# Patient Record
Sex: Female | Born: 1995 | Race: Black or African American | Hispanic: No | Marital: Single | Smoking: Former smoker
Health system: Southern US, Community
[De-identification: ages and names within clinical notes are randomized; demographics above are authoritative.]

## PROBLEM LIST (undated history)

## (undated) DIAGNOSIS — D649 Anemia, unspecified: Secondary | ICD-10-CM

## (undated) DIAGNOSIS — N83209 Unspecified ovarian cyst, unspecified side: Secondary | ICD-10-CM

## (undated) HISTORY — PX: NO PAST SURGERIES: SHX2092

---

## 2020-03-10 ENCOUNTER — Encounter (HOSPITAL_COMMUNITY): Payer: Self-pay

## 2020-03-10 ENCOUNTER — Emergency Department (HOSPITAL_COMMUNITY)
Admission: EM | Admit: 2020-03-10 | Discharge: 2020-03-10 | Disposition: A | Discharge status: Home / Self Care | Payer: Medicaid Other | Attending: Emergency Medicine | Admitting: Emergency Medicine

## 2020-03-10 ENCOUNTER — Other Ambulatory Visit: Payer: Self-pay

## 2020-03-10 DIAGNOSIS — G4489 Other headache syndrome: Secondary | ICD-10-CM | POA: Diagnosis not present

## 2020-03-10 DIAGNOSIS — R519 Headache, unspecified: Secondary | ICD-10-CM | POA: Diagnosis present

## 2020-03-10 LAB — POC URINE PREG, ED: Preg Test, Ur: NEGATIVE

## 2020-03-10 MED ORDER — DEXAMETHASONE SODIUM PHOSPHATE 10 MG/ML IJ SOLN
10.0000 mg | Freq: Once | INTRAMUSCULAR | Status: AC
  Administered 2020-03-10: 10 mg via INTRAMUSCULAR
  Filled 2020-03-10: qty 1

## 2020-03-10 MED ORDER — KETOROLAC TROMETHAMINE 60 MG/2ML IM SOLN
60.0000 mg | Freq: Once | INTRAMUSCULAR | Status: AC
  Administered 2020-03-10: 60 mg via INTRAMUSCULAR
  Filled 2020-03-10: qty 2

## 2020-03-10 NOTE — ED Notes (Signed)
Pt Not in room, pt left room without paperwork or being discharged

## 2020-03-10 NOTE — ED Triage Notes (Signed)
Pt reports headache that has worsened over the last 5 hours. States that she has had headaches before, but not that lasted this long. Endorses photosensitivity. Denies nausea or vomiting,

## 2020-03-10 NOTE — ED Provider Notes (Signed)
[facility]-EMERGENCY DEPT Provider Note   CSN: [id number] Arrival date & time: 03/10/20  0117     History Chief Complaint  Patient presents with  . Headache    Amanda Howe is a 24 y.o. female.  The history is provided by the patient.  Headache Pain location:  L temporal Quality:  Dull Radiates to:  Does not radiate Severity currently:  10/10 Severity at highest:  10/10 Onset quality:  Gradual Duration:  24 weeks Timing:  Constant Progression:  Unchanged Chronicity:  Chronic Similar to prior headaches: yes   Context: not activity, not exposure to bright light, not caffeine, not coughing, not defecating, not eating, not stress, not exposure to cold air, not intercourse, not loud noise and not straining   Relieved by:  Nothing Worsened by:  Nothing Ineffective treatments:  None tried Associated symptoms: no abdominal pain, no back pain, no blurred vision, no congestion, no cough, no diarrhea, no dizziness, no drainage, no ear pain, no eye pain, no facial pain, no fatigue, no fever, no focal weakness, no hearing loss, no loss of balance, no myalgias, no nausea, no near-syncope, no neck pain, no neck stiffness, no numbness, no paresthesias, no photophobia, no seizures, no sinus pressure, no sore throat, no swollen glands, no syncope, no tingling, no URI, no visual change, no vomiting and no weakness   Risk factors: no anger   Patient has chronic daily headaches and states no one can tell her why and no one has tried.  She is not on suppressive medication and nothing helps her headaches.  No f/c/r.  No neck pain.       History reviewed. No pertinent past medical history.  There are no problems to display for this patient.   History reviewed. No pertinent surgical history.   OB History   No obstetric history on file.     History reviewed. No pertinent family history.  Social History   Tobacco Use  . Smoking status: Not on file  Substance Use  Topics  . Alcohol use: Not on file  . Drug use: Not on file    Home Medications Prior to Admission medications   Not on File    Allergies    Patient has no known allergies.  Review of Systems   Review of Systems  Constitutional: Negative for fatigue and fever.  HENT: Negative for congestion, ear pain, hearing loss, postnasal drip, sinus pressure and sore throat.   Eyes: Negative for blurred vision, photophobia and pain.  Respiratory: Negative for cough.   Cardiovascular: Negative for syncope and near-syncope.  Gastrointestinal: Negative for abdominal pain, diarrhea, nausea and vomiting.  Genitourinary: Negative for difficulty urinating.  Musculoskeletal: Negative for back pain, myalgias, neck pain and neck stiffness.  Neurological: Positive for headaches. Negative for dizziness, focal weakness, seizures, weakness, numbness, paresthesias and loss of balance.  All other systems reviewed and are negative.   Physical Exam Updated Vital Signs BP 114/74 (BP Location: Left Arm)   Pulse 66   Temp 98.2 F (36.8 C) (Oral)   Resp 18   Ht 5\' 4"  (1.626 m)   Wt 59 kg   SpO2 100%   BMI 22.31 kg/m   Physical Exam Vitals and nursing note reviewed.  Constitutional:      General: She is not in acute distress.    Appearance: Normal appearance.  HENT:     Head: Normocephalic and atraumatic.     Nose: Nose normal.     Mouth/Throat:  Mouth: Mucous membranes are moist.     Pharynx: Oropharynx is clear.  Eyes:     Extraocular Movements: Extraocular movements intact.     Conjunctiva/sclera: Conjunctivae normal.     Pupils: Pupils are equal, round, and reactive to light.  Cardiovascular:     Rate and Rhythm: Normal rate and regular rhythm.     Pulses: Normal pulses.     Heart sounds: Normal heart sounds.  Pulmonary:     Effort: Pulmonary effort is normal.     Breath sounds: Normal breath sounds.  Abdominal:     General: Abdomen is flat. Bowel sounds are normal.     Palpations:  Abdomen is soft.     Tenderness: There is no abdominal tenderness. There is no guarding.  Musculoskeletal:        General: Normal range of motion.     Cervical back: Normal range of motion and neck supple.  Skin:    General: Skin is warm and dry.     Capillary Refill: Capillary refill takes less than 2 seconds.  Neurological:     General: No focal deficit present.     Mental Status: She is alert and oriented to person, place, and time.     Cranial Nerves: No cranial nerve deficit.     Deep Tendon Reflexes: Reflexes normal.  Psychiatric:        Mood and Affect: Mood normal.        Behavior: Behavior normal.     ED Results / Procedures / Treatments   Labs (all labs ordered are listed, but only abnormal results are displayed) Labs Reviewed  POC URINE PREG, ED    EKG None  Radiology No results found.  Procedures Procedures (including critical care time)  Medications Ordered in ED Medications  ketorolac (TORADOL) injection 60 mg (60 mg Intramuscular Given 03/10/20 0353)  dexamethasone (DECADRON) injection 10 mg (10 mg Intramuscular Given 03/10/20 0353)    ED Course  I have reviewed the triage vital signs and the nursing notes.  Pertinent labs & imaging results that were available during my care of the patient were reviewed by me and considered in my medical decision making (see chart for details).    Patient was medicated.  EDP planned reassessment and referral to headache specialist given ongoing symptoms.  Informed by patient's nurse that the patient left without saying anything to anyone.    Amanda Howe was evaluated in Emergency Department on 03/10/2020 for the symptoms described in the history of present illness. She was evaluated in the context of the global COVID-19 pandemic, which necessitated consideration that the patient might be at risk for infection with the SARS-CoV-2 virus that causes COVID-19. Institutional protocols and algorithms that pertain to the  evaluation of patients at risk for COVID-19 are in a state of rapid change based on information released by regulatory bodies including the CDC and federal and state organizations. These policies and algorithms were followed during the patient's care in the ED.  Final Clinical Impression(s) / ED Diagnoses Final diagnoses:  Other headache syndrome   Patient eloped during treatment    Lee Kalt, MD 03/10/20 7169

## 2020-05-24 ENCOUNTER — Other Ambulatory Visit: Payer: Self-pay

## 2020-05-24 ENCOUNTER — Inpatient Hospital Stay (HOSPITAL_COMMUNITY)
Admission: AD | Admit: 2020-05-24 | Discharge: 2020-05-24 | Disposition: A | Discharge status: Home / Self Care | Payer: Medicaid Other | Attending: Obstetrics & Gynecology | Admitting: Obstetrics & Gynecology

## 2020-05-24 ENCOUNTER — Encounter (HOSPITAL_COMMUNITY): Payer: Self-pay | Admitting: Emergency Medicine

## 2020-05-24 ENCOUNTER — Inpatient Hospital Stay (HOSPITAL_COMMUNITY): Payer: Medicaid Other

## 2020-05-24 ENCOUNTER — Emergency Department (HOSPITAL_COMMUNITY)
Admission: EM | Admit: 2020-05-24 | Discharge: 2020-05-24 | Discharge status: Against Medical Advice | Payer: Medicaid Other

## 2020-05-24 DIAGNOSIS — O3481 Maternal care for other abnormalities of pelvic organs, first trimester: Secondary | ICD-10-CM | POA: Diagnosis not present

## 2020-05-24 DIAGNOSIS — O23591 Infection of other part of genital tract in pregnancy, first trimester: Secondary | ICD-10-CM | POA: Insufficient documentation

## 2020-05-24 DIAGNOSIS — N939 Abnormal uterine and vaginal bleeding, unspecified: Secondary | ICD-10-CM

## 2020-05-24 DIAGNOSIS — B9689 Other specified bacterial agents as the cause of diseases classified elsewhere: Secondary | ICD-10-CM | POA: Diagnosis not present

## 2020-05-24 DIAGNOSIS — N83201 Unspecified ovarian cyst, right side: Secondary | ICD-10-CM | POA: Diagnosis not present

## 2020-05-24 DIAGNOSIS — O368390 Maternal care for abnormalities of the fetal heart rate or rhythm, unspecified trimester, not applicable or unspecified: Secondary | ICD-10-CM

## 2020-05-24 DIAGNOSIS — Z3A01 Less than 8 weeks gestation of pregnancy: Secondary | ICD-10-CM | POA: Diagnosis not present

## 2020-05-24 DIAGNOSIS — N760 Acute vaginitis: Secondary | ICD-10-CM | POA: Diagnosis not present

## 2020-05-24 DIAGNOSIS — Z87891 Personal history of nicotine dependence: Secondary | ICD-10-CM | POA: Insufficient documentation

## 2020-05-24 DIAGNOSIS — O368310 Maternal care for abnormalities of the fetal heart rate or rhythm, first trimester, not applicable or unspecified: Secondary | ICD-10-CM | POA: Diagnosis not present

## 2020-05-24 DIAGNOSIS — O4691 Antepartum hemorrhage, unspecified, first trimester: Secondary | ICD-10-CM

## 2020-05-24 DIAGNOSIS — O209 Hemorrhage in early pregnancy, unspecified: Secondary | ICD-10-CM | POA: Diagnosis present

## 2020-05-24 DIAGNOSIS — O99891 Other specified diseases and conditions complicating pregnancy: Secondary | ICD-10-CM | POA: Diagnosis not present

## 2020-05-24 HISTORY — DX: Anemia, unspecified: D64.9

## 2020-05-24 LAB — WET PREP, GENITAL
Sperm: NONE SEEN
Trich, Wet Prep: NONE SEEN
Yeast Wet Prep HPF POC: NONE SEEN

## 2020-05-24 LAB — CBC WITH DIFFERENTIAL/PLATELET
Abs Immature Granulocytes: 0.04 10*3/uL (ref 0.00–0.07)
Basophils Absolute: 0 10*3/uL (ref 0.0–0.1)
Basophils Relative: 0 %
Eosinophils Absolute: 0.3 10*3/uL (ref 0.0–0.5)
Eosinophils Relative: 3 %
HCT: 35.3 % — ABNORMAL LOW (ref 36.0–46.0)
Hemoglobin: 12.2 g/dL (ref 12.0–15.0)
Immature Granulocytes: 0 %
Lymphocytes Relative: 12 %
Lymphs Abs: 1.5 10*3/uL (ref 0.7–4.0)
MCH: 30.5 pg (ref 26.0–34.0)
MCHC: 34.6 g/dL (ref 30.0–36.0)
MCV: 88.3 fL (ref 80.0–100.0)
Monocytes Absolute: 1.1 10*3/uL — ABNORMAL HIGH (ref 0.1–1.0)
Monocytes Relative: 9 %
Neutro Abs: 9.9 10*3/uL — ABNORMAL HIGH (ref 1.7–7.7)
Neutrophils Relative %: 76 %
Platelets: 266 10*3/uL (ref 150–400)
RBC: 4 MIL/uL (ref 3.87–5.11)
RDW: 11.9 % (ref 11.5–15.5)
WBC: 13 10*3/uL — ABNORMAL HIGH (ref 4.0–10.5)
nRBC: 0 % (ref 0.0–0.2)

## 2020-05-24 LAB — URINALYSIS, ROUTINE W REFLEX MICROSCOPIC
Bilirubin Urine: NEGATIVE
Glucose, UA: NEGATIVE mg/dL
Hgb urine dipstick: NEGATIVE
Ketones, ur: 80 mg/dL — AB
Leukocytes,Ua: NEGATIVE
Nitrite: NEGATIVE
Protein, ur: NEGATIVE mg/dL
Specific Gravity, Urine: 1.013 (ref 1.005–1.030)
pH: 5 (ref 5.0–8.0)

## 2020-05-24 LAB — HCG, QUANTITATIVE, PREGNANCY: hCG, Beta Chain, Quant, S: 69567 m[IU]/mL — ABNORMAL HIGH (ref <5)

## 2020-05-24 LAB — ABO/RH: ABO/RH(D): O POS

## 2020-05-24 LAB — HIV ANTIBODY (ROUTINE TESTING W REFLEX): HIV Screen 4th Generation wRfx: NONREACTIVE

## 2020-05-24 LAB — POCT PREGNANCY, URINE: Preg Test, Ur: POSITIVE — AB

## 2020-05-24 MED ORDER — METRONIDAZOLE 0.75 % VA GEL: 1.0000 | Freq: Two times a day (BID) | VAGINAL | 0 refills | Status: DC

## 2020-05-24 NOTE — ED Notes (Signed)
Pt said she did not want to wait because she needs to get back to her kids and left

## 2020-05-24 NOTE — MAU Note (Signed)
.   Amanda Howe is a 24 y.o. at [redacted]w[redacted]d here in MAU reporting: lower abdominal cramping. Vaginal bleeding like a period earlier today denies vaginal bleeding at present  LMP: 04/10/20 Onset of complaint: today Pain score: 5 Vitals:   05/24/20 1633 05/24/20 1820  BP: 112/72 121/71  Pulse: 78 63  Resp: 14 16  Temp: 98.9 F (37.2 C) 98.2 F (36.8 C)  SpO2: 100% 100%     FHT: Lab orders placed from triage: UA/UPT

## 2020-05-24 NOTE — Discharge Instructions (Signed)
Bacterial Vaginosis  Bacterial vaginosis is an infection of the vagina. It happens when too many normal germs (healthy bacteria) grow in the vagina. This infection puts you at risk for infections from sex (STIs). Treating this infection can lower your risk for some STIs. You should also treat this if you are pregnant. It can cause your baby to be born early. Follow these instructions at home: Medicines  Take over-the-counter and prescription medicines only as told by your doctor.  Take or use your antibiotic medicine as told by your doctor. Do not stop taking or using it even if you start to feel better. General instructions  If you your sexual partner is a woman, tell her that you have this infection. She needs to get treatment if she has symptoms. If you have a female partner, he does not need to be treated.  During treatment: ? Avoid sex. ? Do not douche. ? Avoid alcohol as told. ? Avoid breastfeeding as told.  Drink enough fluid to keep your pee (urine) clear or pale yellow.  Keep your vagina and butt (rectum) clean. ? Wash the area with warm water every day. ? Wipe from front to back after you use the toilet.  Keep all follow-up visits as told by your doctor. This is important. Preventing this condition  Do not douche.  Use only warm water to wash around your vagina.  Use protection when you have sex. This includes: ? Latex condoms. ? Dental dams.  Limit how many people you have sex with. It is best to only have sex with the same person (be monogamous).  Get tested for STIs. Have your partner get tested.  Wear underwear that is cotton or lined with cotton.  Avoid tight pants and pantyhose. This is most important in summer.  Do not use any products that have nicotine or tobacco in them. These include cigarettes and e-cigarettes. If you need help quitting, ask your doctor.  Do not use illegal drugs.  Limit how much alcohol you drink. Contact a doctor if:  Your  symptoms do not get better, even after you are treated.  You have more discharge or pain when you pee (urinate).  You have a fever.  You have pain in your belly (abdomen).  You have pain with sex.  Your bleed from your vagina between periods. Summary  This infection happens when too many germs (bacteria) grow in the vagina.  Treating this condition can lower your risk for some infections from sex (STIs).  You should also treat this if you are pregnant. It can cause early (premature) birth.  Do not stop taking or using your antibiotic medicine even if you start to feel better. This information is not intended to replace advice given to you by your health care provider. Make sure you discuss any questions you have with your health care provider. Document Revised: 08/28/2017 Document Reviewed: 05/31/2016 Elsevier Patient Education  2020 [employer] Inc[employer]   First Trimester of Pregnancy  The first trimester of pregnancy is from week 1 until the end of week 13 (months 1 through 3). During this time, your baby will begin to develop inside you. At 6-8 weeks, the eyes and face are formed, and the heartbeat can be seen on ultrasound. At the end of 12 weeks, all the baby's organs are formed. Prenatal care is all the medical care you receive before the birth of your baby. Make sure you get good prenatal care and follow all of your doctor's instructions. Follow   these instructions at home: Medicines  Take over-the-counter and prescription medicines only as told by your doctor. Some medicines are safe and some medicines are not safe during pregnancy.  Take a prenatal vitamin that contains at least 600 micrograms (mcg) of folic acid.  If you have trouble pooping (constipation), take medicine that will make your stool soft (stool softener) if your doctor approves. Eating and drinking   Eat regular, healthy meals.  Your doctor will tell you the amount of weight gain that is right for  you.  Avoid raw meat and uncooked cheese.  If you feel sick to your stomach (nauseous) or throw up (vomit): ? Eat 4 or 5 small meals a day instead of 3 large meals. ? Try eating a few soda crackers. ? Drink liquids between meals instead of during meals.  To prevent constipation: ? Eat foods that are high in fiber, like fresh fruits and vegetables, whole grains, and beans. ? Drink enough fluids to keep your pee (urine) clear or pale yellow. Activity  Exercise only as told by your doctor. Stop exercising if you have cramps or pain in your lower belly (abdomen) or low back.  Do not exercise if it is too hot, too humid, or if you are in a place of great height (high altitude).  Try to avoid standing for long periods of time. Move your legs often if you must stand in one place for a long time.  Avoid heavy lifting.  Wear low-heeled shoes. Sit and stand up straight.  You can have sex unless your doctor tells you not to. Relieving pain and discomfort  Wear a good support bra if your breasts are sore.  Take warm water baths (sitz baths) to soothe pain or discomfort caused by hemorrhoids. Use hemorrhoid cream if your doctor says it is okay.  Rest with your legs raised if you have leg cramps or low back pain.  If you have puffy, bulging veins (varicose veins) in your legs: ? Wear support hose or compression stockings as told by your doctor. ? Raise (elevate) your feet for 15 minutes, 3-4 times a day. ? Limit salt in your food. Prenatal care  Schedule your prenatal visits by the twelfth week of pregnancy.  Write down your questions. Take them to your prenatal visits.  Keep all your prenatal visits as told by your doctor. This is important. Safety  Wear your seat belt at all times when driving.  Make a list of emergency phone numbers. The list should include numbers for family, friends, the hospital, and police and fire departments. General instructions  Ask your doctor for a  referral to a local prenatal class. Begin classes no later than at the start of month 6 of your pregnancy.  Ask for help if you need counseling or if you need help with nutrition. Your doctor can give you advice or tell you where to go for help.  Do not use hot tubs, steam rooms, or saunas.  Do not douche or use tampons or scented sanitary pads.  Do not cross your legs for long periods of time.  Avoid all herbs and alcohol. Avoid drugs that are not approved by your doctor.  Do not use any tobacco products, including cigarettes, chewing tobacco, and electronic cigarettes. If you need help quitting, ask your doctor. You may get counseling or other support to help you quit.  Avoid cat litter boxes and soil used by cats. These carry germs that can cause birth defects in the   and can cause a loss of your baby (miscarriage) or stillbirth.  Visit your dentist. At home, brush your teeth with a soft toothbrush. Be gentle when you floss. Contact a doctor if:  You are dizzy.  You have mild cramps or pressure in your lower belly.  You have a nagging pain in your belly area.  You continue to feel sick to your stomach, you throw up, or you have watery poop (diarrhea).  You have a bad smelling fluid coming from your vagina.  You have pain when you pee (urinate).  You have increased puffiness (swelling) in your face, hands, legs, or ankles. Get help right away if:  You have a fever.  You are leaking fluid from your vagina.  You have spotting or bleeding from your vagina.  You have very bad belly cramping or pain.  You gain or lose weight rapidly.  You throw up blood. It may look like coffee grounds.  You are around people who have [place] measles, fifth disease, or chickenpox.  You have a very bad headache.  You have shortness of breath.  You have any kind of trauma, such as from a fall or a car accident. Summary  The first trimester of pregnancy is from week 1 until the end of week 13  (months 1 through 3).  To take care of yourself and your unborn baby, you will need to eat healthy meals, take medicines only if your doctor tells you to do so, and do activities that are safe for you and your baby.  Keep all follow-up visits as told by your doctor. This is important as your doctor will have to ensure that your baby is healthy and growing well. This information is not intended to replace advice given to you by your health care provider. Make sure you discuss any questions you have with your health care provider. Document Revised: 01/06/2019 Document Reviewed: 09/23/2016 Elsevier Patient Education  2020 [employer].

## 2020-05-24 NOTE — ED Triage Notes (Signed)
Pt reports she is [redacted] weeks pregnant and has developed vaginal bleeding today.

## 2020-05-24 NOTE — MAU Provider Note (Signed)
History     CSN: [id number]  Arrival date and time: 05/24/20 1629   First Provider Initiated Contact with Patient 05/24/20 1843      Chief Complaint  Patient presents with  . Vaginal Bleeding  . Abdominal Pain   HPI  [name] is a 24 y.o. G2P1001 at [redacted]w[redacted]d who presents to MAU today with complaint of vaginal bleeding since earlier today. The patient states initially it was "a lot" of bleeding and then when she went to [facility] on arrival there was no blood noted. She states mild associated cramping rated at 4/10. No pain meds taken. She denies abnormal discharge, UTI symptoms or fever. She has had intermittent mild N/V. She was seen at the pregnancy care center recently and has pictures that look like IUGS with YS. She states they told her it looked ok, but was too small to be sure. She has just moved to the area from [facility].   OB History    Gravida  2   Para  1   Term  1   Preterm      AB      Living  1     SAB      TAB      Ectopic      Multiple      Live Births  1           Past Medical History:  Diagnosis Date  . Anemia     History reviewed. No pertinent surgical history.  Family History  Problem Relation Age of Onset  . Hypertension Mother   . Hypertension Maternal Grandmother     Social History   Tobacco Use  . Smoking status: Former Games developer  . Smokeless tobacco: Former Clinical biochemist  . Vaping Use: Never used  Substance Use Topics  . Alcohol use: Not Currently  . Drug use: Never    Allergies: No Known Allergies  No medications prior to admission.    Review of Systems  Constitutional: Negative for fever.  Gastrointestinal: Positive for abdominal pain, nausea and vomiting. Negative for constipation and diarrhea.  Genitourinary: Positive for vaginal bleeding. Negative for dysuria, frequency, urgency and vaginal discharge.   Physical Exam   Blood pressure 110/62, pulse 65, temperature 98.2 F (36.8 C), resp. rate 15, height 5\' 4"   (1.626 m), weight 64.9 kg, last menstrual period 04/10/2020, SpO2 100 %.  Physical Exam Vitals and nursing note reviewed. Exam conducted with a chaperone present.  Constitutional:      General: She is not in acute distress.    Appearance: She is well-developed and normal weight.  HENT:     Head: Normocephalic and atraumatic.  Cardiovascular:     Rate and Rhythm: Normal rate.  Pulmonary:     Effort: Pulmonary effort is normal.  Abdominal:     General: Abdomen is flat. Bowel sounds are normal. There is no distension.     Palpations: Abdomen is soft. There is no mass.     Tenderness: There is no abdominal tenderness. There is no guarding or rebound.  Genitourinary:    Vagina: Bleeding (scant pink) present. No vaginal discharge.     Cervix: Normal.     Uterus: Normal. Not enlarged and not tender.      Adnexa: Right adnexa normal and left adnexa normal.       Right: No mass or tenderness.         Left: No mass or tenderness.  Comments: Cervix: closed, thick, firm Skin:    General: Skin is warm and dry.     Findings: No erythema.  Neurological:     Mental Status: She is alert and oriented to person, place, and time.     Results for orders placed or performed during the hospital encounter of 05/24/20 (from the past 24 hour(s))  Pregnancy, urine POC     Status: Abnormal   Collection Time: 05/24/20  6:15 PM  Result Value Ref Range   Preg Test, Ur POSITIVE (A) NEGATIVE  Urinalysis, Routine w reflex microscopic Urine, Clean Catch     Status: Abnormal   Collection Time: 05/24/20  6:23 PM  Result Value Ref Range   Color, Urine YELLOW YELLOW   APPearance CLEAR CLEAR   Specific Gravity, Urine 1.013 1.005 - 1.030   pH 5.0 5.0 - 8.0   Glucose, UA NEGATIVE NEGATIVE mg/dL   Hgb urine dipstick NEGATIVE NEGATIVE   Bilirubin Urine NEGATIVE NEGATIVE   Ketones, ur 80 (A) NEGATIVE mg/dL   Protein, ur NEGATIVE NEGATIVE mg/dL   Nitrite NEGATIVE NEGATIVE   Leukocytes,Ua NEGATIVE NEGATIVE   Wet prep, genital     Status: Abnormal   Collection Time: 05/24/20  6:50 PM  Result Value Ref Range   Yeast Wet Prep HPF POC NONE SEEN NONE SEEN   Trich, Wet Prep NONE SEEN NONE SEEN   Clue Cells Wet Prep HPF POC PRESENT (A) NONE SEEN   WBC, Wet Prep HPF POC MANY (A) NONE SEEN   Sperm NONE SEEN   CBC with Differential/Platelet     Status: Abnormal   Collection Time: 05/24/20  7:02 PM  Result Value Ref Range   WBC 13.0 (H) 4.0 - 10.5 K/uL   RBC 4.00 3.87 - 5.11 MIL/uL   Hemoglobin 12.2 12.0 - 15.0 g/dL   HCT 56.4 (L) 36 - 46 %   MCV 88.3 80.0 - 100.0 fL   MCH 30.5 26.0 - 34.0 pg   MCHC 34.6 30.0 - 36.0 g/dL   RDW 33.2 95.1 - 88.4 %   Platelets 266 150 - 400 K/uL   nRBC 0.0 0.0 - 0.2 %   Neutrophils Relative % 76 %   Neutro Abs 9.9 (H) 1.7 - 7.7 K/uL   Lymphocytes Relative 12 %   Lymphs Abs 1.5 0.7 - 4.0 K/uL   Monocytes Relative 9 %   Monocytes Absolute 1.1 (H) 0 - 1 K/uL   Eosinophils Relative 3 %   Eosinophils Absolute 0.3 0 - 0 K/uL   Basophils Relative 0 %   Basophils Absolute 0.0 0 - 0 K/uL   Immature Granulocytes 0 %   Abs Immature Granulocytes 0.04 0.00 - 0.07 K/uL  ABO/Rh     Status: None   Collection Time: 05/24/20  7:02 PM  Result Value Ref Range   ABO/RH(D) O POS    No rh immune globuloin      NOT A RH IMMUNE GLOBULIN CANDIDATE, PT RH POSITIVE Performed at [facility], [address] [place], [place] [address]   hCG, quantitative, pregnancy     Status: Abnormal   Collection Time: 05/24/20  7:02 PM  Result Value Ref Range   hCG, Beta Chain, Quant, S 69,567 (H) <5 mIU/mL   US OB LESS THAN 14 WEEKS WITH OB TRANSVAGINAL  Result Date: 05/24/2020 CLINICAL DATA:  Bleeding, positive pregnancy test EXAM: OBSTETRIC <14 WK [place] AND TRANSVAGINAL OB US TECHNIQUE: Both transabdominal and transvaginal ultrasound examinations were performed for  complete evaluation of the gestation as well as the maternal uterus, adnexal regions, and pelvic cul-de-sac. Transvaginal  technique was performed to assess early pregnancy. COMPARISON:  None. FINDINGS: Intrauterine gestational sac: Present Yolk sac:  Present Embryo:  Present Cardiac Activity: Present Heart Rate: 94 bpm CRL:  3 mm   5 w   5 d                  [place] EDC: 01/22/2021 Subchorionic hemorrhage:  None visualized. Maternal uterus/adnexae: Ovaries are well visualized. 2 cm cyst is noted within the right ovary. IMPRESSION: Single live intrauterine gestation at 5 weeks 5 days. Continued follow-up as clinically indicated. Electronically Signed   By: Alcide Clever M.D.   On: 05/24/2020 19:56    MAU Course  Procedures None  MDM +UPT UA, wet prep, GC/chlamydia, CBC, ABO/Rh, quant hCG, HIV, RPR and [place] today to rule out ectopic pregnancy  Assessment and Plan  A: SIUP at [redacted]w[redacted]d Fetal bradycardia Vaginal bleeding in pregnancy, first trimester  Bacterial vaginosis   P: Discharge home Rx for Metrogel sent to patient's pharmacy  Bleeding/first trimester precautions discussed Patient advised to follow-up with OB provider of choice. List given.  Outpatient follow-up US ordered in 2 weeks due to fetal bradycardia. I will contact patient with results.  Patient may return to MAU as needed or if her condition were to change or worsen  Vonzella Nipple, PA-C 05/24/2020, 8:27 PM

## 2020-05-24 NOTE — ED Notes (Signed)
Transport called to take pt over

## 2020-05-24 NOTE — ED Triage Notes (Signed)
Emergency Medicine Provider OB Triage Evaluation Note  Amanda Howe is a 24 y.o. female, G2P1, at Unknown gestation who presents to the emergency department with complaints of vaginal bleeding in pregnancy.   July 13th first day lmp. She has had cramping pain for a week, bleeding started today, came right here.  No change in pain when bleeding started  Pregnancy network.    Review of  Systems  Positive: Vaginal bleeding Negative: fevers  Physical Exam  BP 112/72 (BP Location: Left Arm)   Pulse 78   Temp 98.9 F (37.2 C) (Oral)   Resp 14   Ht 5\' 4"  (1.626 m)   Wt 64.9 kg   SpO2 100%   BMI 24.55 kg/m  General: Awake, no distress  HEENT: Atraumatic  Resp: Normal effort  Cardiac: Normal rate Abd: Nondistended, nontender  MSK: Moves all extremities without difficulty Neuro: Speech clear  Medical Decision Making  Pt evaluated for pregnancy concern and is stable for transfer to MAU. Pt is in agreement with plan for transfer.  5:17 PM Discussed with MAU APP, , who accepts patient in transfer.  Clinical Impression  No diagnosis found.     Raynelle Fanning, Cristina Gong 05/24/20 1736

## 2020-05-25 LAB — GC/CHLAMYDIA PROBE AMP (~~LOC~~) NOT AT ARMC
Chlamydia: NEGATIVE
Comment: NEGATIVE
Comment: NORMAL
Neisseria Gonorrhea: NEGATIVE

## 2020-05-25 LAB — RPR: RPR Ser Ql: NONREACTIVE

## 2020-05-30 ENCOUNTER — Inpatient Hospital Stay (HOSPITAL_COMMUNITY)
Admission: AD | Admit: 2020-05-30 | Discharge: 2020-05-31 | Disposition: A | Discharge status: Home / Self Care | Payer: Medicaid Other | Attending: Obstetrics & Gynecology | Admitting: Obstetrics & Gynecology

## 2020-05-30 ENCOUNTER — Encounter (HOSPITAL_COMMUNITY): Payer: Self-pay | Admitting: Obstetrics & Gynecology

## 2020-05-30 ENCOUNTER — Other Ambulatory Visit: Payer: Self-pay

## 2020-05-30 DIAGNOSIS — Z3A01 Less than 8 weeks gestation of pregnancy: Secondary | ICD-10-CM | POA: Insufficient documentation

## 2020-05-30 DIAGNOSIS — O211 Hyperemesis gravidarum with metabolic disturbance: Secondary | ICD-10-CM

## 2020-05-30 DIAGNOSIS — O99281 Endocrine, nutritional and metabolic diseases complicating pregnancy, first trimester: Secondary | ICD-10-CM | POA: Insufficient documentation

## 2020-05-30 DIAGNOSIS — Z79899 Other long term (current) drug therapy: Secondary | ICD-10-CM | POA: Insufficient documentation

## 2020-05-30 DIAGNOSIS — E860 Dehydration: Secondary | ICD-10-CM | POA: Insufficient documentation

## 2020-05-30 DIAGNOSIS — O219 Vomiting of pregnancy, unspecified: Secondary | ICD-10-CM | POA: Diagnosis present

## 2020-05-30 DIAGNOSIS — R112 Nausea with vomiting, unspecified: Secondary | ICD-10-CM

## 2020-05-30 DIAGNOSIS — Z87891 Personal history of nicotine dependence: Secondary | ICD-10-CM | POA: Insufficient documentation

## 2020-05-30 LAB — CBC
HCT: 36.7 % (ref 36.0–46.0)
Hemoglobin: 12.7 g/dL (ref 12.0–15.0)
MCH: 30.4 pg (ref 26.0–34.0)
MCHC: 34.6 g/dL (ref 30.0–36.0)
MCV: 87.8 fL (ref 80.0–100.0)
Platelets: 277 10*3/uL (ref 150–400)
RBC: 4.18 MIL/uL (ref 3.87–5.11)
RDW: 11.9 % (ref 11.5–15.5)
WBC: 16 10*3/uL — ABNORMAL HIGH (ref 4.0–10.5)
nRBC: 0 % (ref 0.0–0.2)

## 2020-05-30 LAB — COMPREHENSIVE METABOLIC PANEL
ALT: 14 U/L (ref 0–44)
AST: 19 U/L (ref 15–41)
Albumin: 3.9 g/dL (ref 3.5–5.0)
Alkaline Phosphatase: 40 U/L (ref 38–126)
Anion gap: 10 (ref 5–15)
BUN: 7 mg/dL (ref 6–20)
CO2: 24 mmol/L (ref 22–32)
Calcium: 9.3 mg/dL (ref 8.9–10.3)
Chloride: 103 mmol/L (ref 98–111)
Creatinine, Ser: 0.84 mg/dL (ref 0.44–1.00)
GFR calc Af Amer: >60 mL/min (ref >60)
GFR calc non Af Amer: >60 mL/min (ref >60)
Glucose, Bld: 83 mg/dL (ref 70–99)
Potassium: 3.9 mmol/L (ref 3.5–5.1)
Sodium: 137 mmol/L (ref 135–145)
Total Bilirubin: 0.8 mg/dL (ref 0.3–1.2)
Total Protein: 7 g/dL (ref 6.5–8.1)

## 2020-05-30 LAB — URINALYSIS, ROUTINE W REFLEX MICROSCOPIC
Bilirubin Urine: NEGATIVE
Glucose, UA: NEGATIVE mg/dL
Hgb urine dipstick: NEGATIVE
Ketones, ur: 80 mg/dL — AB
Leukocytes,Ua: NEGATIVE
Nitrite: NEGATIVE
Protein, ur: NEGATIVE mg/dL
Specific Gravity, Urine: 1.014 (ref 1.005–1.030)
pH: 5 (ref 5.0–8.0)

## 2020-05-30 MED ORDER — FAMOTIDINE IN NACL 20-0.9 MG/50ML-% IV SOLN
20.0000 mg | Freq: Once | INTRAVENOUS | Status: AC
  Administered 2020-05-30: 20 mg via INTRAVENOUS
  Filled 2020-05-30: qty 50

## 2020-05-30 MED ORDER — LACTATED RINGERS IV SOLN: Freq: Once | INTRAVENOUS | Status: AC

## 2020-05-30 MED ORDER — ONDANSETRON HCL 4 MG/2ML IJ SOLN
4.0000 mg | Freq: Once | INTRAMUSCULAR | Status: AC
  Administered 2020-05-30: 4 mg via INTRAVENOUS
  Filled 2020-05-30: qty 2

## 2020-05-30 NOTE — MAU Note (Signed)
Unable to keep down anything for 24hrs. Feeling dizzy and unable to do anything. No pain.

## 2020-05-30 NOTE — MAU Provider Note (Signed)
Chief Complaint: Emesis During Pregnancy   First Provider Initiated Contact with Patient 05/30/20 2047        SUBJECTIVE HPI: Amanda Howe is a 24 y.o. G2P1001 at [redacted]w[redacted]d by LMP who presents to maternity admissions reporting nausea and vomiting for the past several weeks.  Worse over the past 24 hours.  Unable to keep anything down.  States has vomited 8 times in the past hour.   She denies vaginal bleeding, vaginal itching/burning, urinary symptoms, h/a, dizziness,or fever/chills.    Emesis  This is a recurrent problem. The current episode started 1 to 4 weeks ago. The problem occurs 5 to 10 times per day. The problem has been unchanged. There has been no fever. Pertinent negatives include no abdominal pain, chills, coughing, diarrhea, dizziness, fever, headaches or myalgias. She has tried nothing for the symptoms.   RN Note: Unable to keep down anything for 24hrs. Feeling dizzy and unable to do anything. No pain.   Past Medical History:  Diagnosis Date  . Anemia    No past surgical history on file. Social History   Socioeconomic History  . Marital status: Single    Spouse name: Not on file  . Number of children: Not on file  . Years of education: Not on file  . Highest education level: Not on file  Occupational History  . Not on file  Tobacco Use  . Smoking status: Former Games developer  . Smokeless tobacco: Former Clinical biochemist  . Vaping Use: Never used  Substance and Sexual Activity  . Alcohol use: Not Currently  . Drug use: Never  . Sexual activity: Yes  Other Topics Concern  . Not on file  Social History Narrative  . Not on file   Social Determinants of Health   Financial Resource Strain:   . Difficulty of Paying Living Expenses: Not on file  Food Insecurity:   . Worried About Programme researcher, broadcasting/film/video in the Last Year: Not on file  . Ran Out of Food in the Last Year: Not on file  Transportation Needs:   . Lack of Transportation (Medical): Not on file  . Lack of  Transportation (Non-Medical): Not on file  Physical Activity:   . Days of Exercise per Week: Not on file  . Minutes of Exercise per Session: Not on file  Stress:   . Feeling of Stress : Not on file  Social Connections:   . Frequency of Communication with Friends and Family: Not on file  . Frequency of Social Gatherings with Friends and Family: Not on file  . Attends Religious Services: Not on file  . Active Member of Clubs or Organizations: Not on file  . Attends Banker Meetings: Not on file  . Marital Status: Not on file  Intimate Partner Violence:   . Fear of Current or Ex-Partner: Not on file  . Emotionally Abused: Not on file  . Physically Abused: Not on file  . Sexually Abused: Not on file   No current facility-administered medications on file prior to encounter.   Current Outpatient Medications on File Prior to Encounter  Medication Sig Dispense Refill  . metroNIDAZOLE (METROGEL VAGINAL) 0.75 % vaginal gel Place 1 Applicatorful vaginally 2 (two) times daily. 70 g 0  . Prenatal Vit-Fe Fumarate-FA (PRENATAL MULTIVITAMIN) TABS tablet Take 1 tablet by mouth daily at 12 noon.     No Known Allergies  I have reviewed patient's Past Medical Hx, Surgical Hx, Family Hx, Social Hx, medications and allergies.  ROS:  Review of Systems  Constitutional: Negative for chills and fever.  Respiratory: Negative for cough.   Gastrointestinal: Positive for vomiting. Negative for abdominal pain and diarrhea.  Musculoskeletal: Negative for myalgias.  Neurological: Negative for dizziness and headaches.   Review of Systems  Other systems negative   Physical Exam  Physical Exam Patient Vitals for the past 24 hrs:  BP Temp Pulse Resp Height Weight  05/30/20 2035 118/64 98.5 F (36.9 C) 66 18 5\' 4"  (1.626 m) 64 kg   Constitutional: Well-developed, well-nourished female in no acute distress.  Cardiovascular: normal rate Respiratory: normal effort GI: Abd soft, non-tender.  Pos BS x 4 MS: Extremities nontender, no edema, normal ROM Neurologic: Alert and oriented x 4.  GU: Neg CVAT.  PELVIC EXAM: deferred  LAB RESULTS Results for orders placed or performed during the hospital encounter of 05/30/20 (from the past 24 hour(s))  Urinalysis, Routine w reflex microscopic Urine, Clean Catch     Status: Abnormal   Collection Time: 05/30/20  8:39 PM  Result Value Ref Range   Color, Urine YELLOW YELLOW   APPearance CLEAR CLEAR   Specific Gravity, Urine 1.014 1.005 - 1.030   pH 5.0 5.0 - 8.0   Glucose, UA NEGATIVE NEGATIVE mg/dL   Hgb urine dipstick NEGATIVE NEGATIVE   Bilirubin Urine NEGATIVE NEGATIVE   Ketones, ur 80 (A) NEGATIVE mg/dL   Protein, ur NEGATIVE NEGATIVE mg/dL   Nitrite NEGATIVE NEGATIVE   Leukocytes,Ua NEGATIVE NEGATIVE  CBC     Status: Abnormal   Collection Time: 05/30/20  9:22 PM  Result Value Ref Range   WBC 16.0 (H) 4.0 - 10.5 K/uL   RBC 4.18 3.87 - 5.11 MIL/uL   Hemoglobin 12.7 12.0 - 15.0 g/dL   HCT 07/30/20 36 - 46 %   MCV 87.8 80.0 - 100.0 fL   MCH 30.4 26.0 - 34.0 pg   MCHC 34.6 30.0 - 36.0 g/dL   RDW 76.2 83.1 - 51.7 %   Platelets 277 150 - 400 K/uL   nRBC 0.0 0.0 - 0.2 %  Comprehensive metabolic panel     Status: None   Collection Time: 05/30/20  9:22 PM  Result Value Ref Range   Sodium 137 135 - 145 mmol/L   Potassium 3.9 3.5 - 5.1 mmol/L   Chloride 103 98 - 111 mmol/L   CO2 24 22 - 32 mmol/L   Glucose, Bld 83 70 - 99 mg/dL   BUN 7 6 - 20 mg/dL   Creatinine, Ser 07/30/20 0.44 - 1.00 mg/dL   Calcium 9.3 8.9 - 0.73 mg/dL   Total Protein 7.0 6.5 - 8.1 g/dL   Albumin 3.9 3.5 - 5.0 g/dL   AST 19 15 - 41 U/L   ALT 14 0 - 44 U/L   Alkaline Phosphatase 40 38 - 126 U/L   Total Bilirubin 0.8 0.3 - 1.2 mg/dL   GFR calc non Af Amer >60 >60 mL/min   GFR calc Af Amer >60 >60 mL/min   Anion gap 10 5 - 15   --/--/O POS (08/26 1902)  IMAGING   MAU Management/MDM: Ordered IV hydration, Zofran and Pepcid.   We gave her two liters of  fluid She was able to tolerate PO intake afterward.   ASSESSMENT Single IUP at [redacted]w[redacted]d Nausea and vomiting Dehydration  PLAN Discharge home Rx Phenergan for nausea Has appt with me in 2 weeks for New OB appointment  Pt stable at time of discharge. Encouraged to return here  or to other Urgent Care/ED if she develops worsening of symptoms, increase in pain, fever, or other concerning symptoms.    Wynelle Bourgeois CNM, MSN Certified Nurse-Midwife 05/30/2020  8:48 PM

## 2020-05-31 MED ORDER — PROMETHAZINE HCL 25 MG PO TABS
25.0000 mg | ORAL_TABLET | Freq: Four times a day (QID) | ORAL | 2 refills | Status: DC | PRN
Start: 2020-05-31 — End: 2020-07-10

## 2020-05-31 NOTE — Discharge Instructions (Signed)

## 2020-05-31 NOTE — Progress Notes (Signed)
Written and verbal d/c instructions given and understanding voiced. Understands has promethazine at pharmacy to pick up and use for nausea/vomiting

## 2020-06-06 ENCOUNTER — Ambulatory Visit
Admission: RE | Admit: 2020-06-06 | Discharge: 2020-06-06 | Disposition: A | Discharge status: Home / Self Care | Payer: Medicaid Other | Source: Ambulatory Visit | Attending: Medical | Admitting: Medical

## 2020-06-06 ENCOUNTER — Other Ambulatory Visit: Payer: Self-pay

## 2020-06-06 ENCOUNTER — Telehealth: Payer: Self-pay | Admitting: Medical

## 2020-06-06 DIAGNOSIS — O368390 Maternal care for abnormalities of the fetal heart rate or rhythm, unspecified trimester, not applicable or unspecified: Secondary | ICD-10-CM | POA: Insufficient documentation

## 2020-06-06 NOTE — Telephone Encounter (Signed)
I connected with [name] by phone today. I have confirmed her identity using 2 identifiers. [place] results from this morning were discussed. Fetal bradycardia previously seen has resolved. FHR 161 bpm today. Discussed first trimester warning signs and reasons to return to MAU. Patient has an appointment to start prenatal care at [facility]. All questions were answered and patient voiced understanding of results today.   Vonzella Nipple, PA-C 06/06/2020 12:07 PM

## 2020-06-12 ENCOUNTER — Other Ambulatory Visit (HOSPITAL_COMMUNITY)
Admission: RE | Admit: 2020-06-12 | Discharge: 2020-06-12 | Disposition: A | Discharge status: Home / Self Care | Payer: Medicaid Other | Source: Ambulatory Visit | Attending: Advanced Practice Midwife | Admitting: Advanced Practice Midwife

## 2020-06-12 ENCOUNTER — Encounter: Payer: Self-pay | Admitting: Advanced Practice Midwife

## 2020-06-12 ENCOUNTER — Other Ambulatory Visit: Payer: Self-pay

## 2020-06-12 ENCOUNTER — Ambulatory Visit (INDEPENDENT_AMBULATORY_CARE_PROVIDER_SITE_OTHER): Payer: Medicaid Other | Admitting: Advanced Practice Midwife

## 2020-06-12 VITALS — BP 106/65 | HR 80 | Wt 135.0 lb

## 2020-06-12 DIAGNOSIS — Z3481 Encounter for supervision of other normal pregnancy, first trimester: Secondary | ICD-10-CM | POA: Diagnosis not present

## 2020-06-12 DIAGNOSIS — O219 Vomiting of pregnancy, unspecified: Secondary | ICD-10-CM

## 2020-06-12 DIAGNOSIS — Z3A09 9 weeks gestation of pregnancy: Secondary | ICD-10-CM

## 2020-06-12 DIAGNOSIS — Z3480 Encounter for supervision of other normal pregnancy, unspecified trimester: Secondary | ICD-10-CM | POA: Insufficient documentation

## 2020-06-12 DIAGNOSIS — O209 Hemorrhage in early pregnancy, unspecified: Secondary | ICD-10-CM

## 2020-06-12 MED ORDER — ONDANSETRON 4 MG PO TBDP: 4.0000 mg | ORAL_TABLET | Freq: Four times a day (QID) | ORAL | 0 refills | Status: DC | PRN

## 2020-06-12 MED ORDER — PANTOPRAZOLE SODIUM 20 MG PO TBEC: 20.0000 mg | DELAYED_RELEASE_TABLET | Freq: Every day | ORAL | 0 refills | Status: DC

## 2020-06-12 NOTE — Patient Instructions (Signed)
First Trimester of Pregnancy The first trimester of pregnancy is from week 1 until the end of week 13 (months 1 through 3). A week after a sperm fertilizes an egg, the egg will implant on the wall of the uterus. This embryo will begin to develop into a baby. Genes from you and your partner will form the baby. The female genes will determine whether the baby will be a boy or a girl. At 6-8 weeks, the eyes and face will be formed, and the heartbeat can be seen on ultrasound. At the end of 12 weeks, all the baby's organs will be formed. Now that you are pregnant, you will want to do everything you can to have a healthy baby. Two of the most important things are to get good prenatal care and to follow your health care provider's instructions. Prenatal care is all the medical care you receive before the baby's birth. This care will help prevent, find, and treat any problems during the pregnancy and childbirth. Body changes during your first trimester Your body goes through many changes during pregnancy. The changes vary from woman to woman.  You may gain or lose a couple of pounds at first.  You may feel sick to your stomach (nauseous) and you may throw up (vomit). If the vomiting is uncontrollable, call your health care provider.  You may tire easily.  You may develop headaches that can be relieved by medicines. All medicines should be approved by your health care provider.  You may urinate more often. Painful urination may mean you have a bladder infection.  You may develop heartburn as a result of your pregnancy.  You may develop constipation because certain hormones are causing the muscles that push stool through your intestines to slow down.  You may develop hemorrhoids or swollen veins (varicose veins).  Your breasts may begin to grow larger and become tender. Your nipples may stick out more, and the tissue that surrounds them (areola) may become darker.  Your gums may bleed and may be  sensitive to brushing and flossing.  Dark spots or blotches (chloasma, mask of pregnancy) may develop on your face. This will likely fade after the baby is born.  Your menstrual periods will stop.  You may have a loss of appetite.  You may develop cravings for certain kinds of food.  You may have changes in your emotions from day to day, such as being excited to be pregnant or being concerned that something may go wrong with the pregnancy and baby.  You may have more vivid and strange dreams.  You may have changes in your hair. These can include thickening of your hair, rapid growth, and changes in texture. Some women also have hair loss during or after pregnancy, or hair that feels dry or thin. Your hair will most likely return to normal after your baby is born. What to expect at prenatal visits During a routine prenatal visit:  You will be weighed to make sure you and the baby are growing normally.  Your blood pressure will be taken.  Your abdomen will be measured to track your baby's growth.  The fetal heartbeat will be listened to between weeks 10 and 14 of your pregnancy.  Test results from any previous visits will be discussed. Your health care provider may ask you:  How you are feeling.  If you are feeling the baby move.  If you have had any abnormal symptoms, such as leaking fluid, bleeding, severe headaches, or abdominal   cramping.  If you are using any tobacco products, including cigarettes, chewing tobacco, and electronic cigarettes.  If you have any questions. Other tests that may be performed during your first trimester include:  Blood tests to find your blood type and to check for the presence of any previous infections. The tests will also be used to check for low iron levels (anemia) and protein on red blood cells (Rh antibodies). Depending on your risk factors, or if you previously had diabetes during pregnancy, you may have tests to check for high blood sugar  that affects pregnant women (gestational diabetes).  Urine tests to check for infections, diabetes, or protein in the urine.  An ultrasound to confirm the proper growth and development of the baby.  Fetal screens for spinal cord problems (spina bifida) and Down syndrome.  HIV (human immunodeficiency virus) testing. Routine prenatal testing includes screening for HIV, unless you choose not to have this test.  You may need other tests to make sure you and the baby are doing well. Follow these instructions at home: Medicines  Follow your health care provider's instructions regarding medicine use. Specific medicines may be either safe or unsafe to take during pregnancy.  Take a prenatal vitamin that contains at least 600 micrograms (mcg) of folic acid.  If you develop constipation, try taking a stool softener if your health care provider approves. Eating and drinking   Eat a balanced diet that includes fresh fruits and vegetables, whole grains, good sources of protein such as meat, eggs, or tofu, and low-fat dairy. Your health care provider will help you determine the amount of weight gain that is right for you.  Avoid raw meat and uncooked cheese. These carry germs that can cause birth defects in the baby.  Eating four or five small meals rather than three large meals a day may help relieve nausea and vomiting. If you start to feel nauseous, eating a few soda crackers can be helpful. Drinking liquids between meals, instead of during meals, also seems to help ease nausea and vomiting.  Limit foods that are high in fat and processed sugars, such as fried and sweet foods.  To prevent constipation: ? Eat foods that are high in fiber, such as fresh fruits and vegetables, whole grains, and beans. ? Drink enough fluid to keep your urine clear or pale yellow. Activity  Exercise only as directed by your health care provider. Most women can continue their usual exercise routine during  pregnancy. Try to exercise for 30 minutes at least 5 days a week. Exercising will help you: ? Control your weight. ? Stay in shape. ? Be prepared for labor and delivery.  Experiencing pain or cramping in the lower abdomen or lower back is a good sign that you should stop exercising. Check with your health care provider before continuing with normal exercises.  Try to avoid standing for long periods of time. Move your legs often if you must stand in one place for a long time.  Avoid heavy lifting.  Wear low-heeled shoes and practice good posture.  You may continue to have sex unless your health care provider tells you not to. Relieving pain and discomfort  Wear a good support bra to relieve breast tenderness.  Take warm sitz baths to soothe any pain or discomfort caused by hemorrhoids. Use hemorrhoid cream if your health care provider approves.  Rest with your legs elevated if you have leg cramps or low back pain.  If you develop varicose veins in   your legs, wear support hose. Elevate your feet for 15 minutes, 3-4 times a day. Limit salt in your diet. Prenatal care  Schedule your prenatal visits by the twelfth week of pregnancy. They are usually scheduled monthly at first, then more often in the last 2 months before delivery.  Write down your questions. Take them to your prenatal visits.  Keep all your prenatal visits as told by your health care provider. This is important. Safety  Wear your seat belt at all times when driving.  Make a list of emergency phone numbers, including numbers for family, friends, the hospital, and police and fire departments. General instructions  Ask your health care provider for a referral to a local prenatal education class. Begin classes no later than the beginning of month 6 of your pregnancy.  Ask for help if you have counseling or nutritional needs during pregnancy. Your health care provider can offer advice or refer you to specialists for help  with various needs.  Do not use hot tubs, steam rooms, or saunas.  Do not douche or use tampons or scented sanitary pads.  Do not cross your legs for long periods of time.  Avoid cat litter boxes and soil used by cats. These carry germs that can cause birth defects in the baby and possibly loss of the fetus by miscarriage or stillbirth.  Avoid all smoking, herbs, alcohol, and medicines not prescribed by your health care provider. Chemicals in these products affect the formation and growth of the baby.  Do not use any products that contain nicotine or tobacco, such as cigarettes and e-cigarettes. If you need help quitting, ask your health care provider. You may receive counseling support and other resources to help you quit.  Schedule a dentist appointment. At home, brush your teeth with a soft toothbrush and be gentle when you floss. Contact a health care provider if:  You have dizziness.  You have mild pelvic cramps, pelvic pressure, or nagging pain in the abdominal area.  You have persistent nausea, vomiting, or diarrhea.  You have a bad smelling vaginal discharge.  You have pain when you urinate.  You notice increased swelling in your face, hands, legs, or ankles.  You are exposed to fifth disease or chickenpox.  You are exposed to German measles (rubella) and have never had it. Get help right away if:  You have a fever.  You are leaking fluid from your vagina.  You have spotting or bleeding from your vagina.  You have severe abdominal cramping or pain.  You have rapid weight gain or loss.  You vomit blood or material that looks like coffee grounds.  You develop a severe headache.  You have shortness of breath.  You have any kind of trauma, such as from a fall or a car accident. Summary  The first trimester of pregnancy is from week 1 until the end of week 13 (months 1 through 3).  Your body goes through many changes during pregnancy. The changes vary from  woman to woman.  You will have routine prenatal visits. During those visits, your health care provider will examine you, discuss any test results you may have, and talk with you about how you are feeling. This information is not intended to replace advice given to you by your health care provider. Make sure you discuss any questions you have with your health care provider. Document Revised: 08/28/2017 Document Reviewed: 08/27/2016 Elsevier Patient Education  2020 [employer]  

## 2020-06-12 NOTE — Progress Notes (Signed)
  Subjective:    Amanda Howe is a G2P1001 [redacted]w[redacted]d being seen today for her first obstetrical visit.  Her obstetrical history is significant for history of rapid labor with first baby. Patient does not intend to breast feed. Pregnancy history fully reviewed.  Patient reports nausea.  Phenergan not helping.  Vitals:   06/12/20 0924  BP: 106/65  Pulse: 80  Weight: 135 lb (61.2 kg)    HISTORY: OB History  Gravida Para Term Preterm AB Living  2 1 1     1   SAB TAB Ectopic Multiple Live Births          1    # Outcome Date GA Lbr Len/2nd Weight Sex Delivery Anes PTL Lv  2 Current           1 Term 07/08/14    M Vag-Spont   LIV   Past Medical History:  Diagnosis Date  . Anemia    History reviewed. No pertinent surgical history. Family History  Problem Relation Age of Onset  . Hypertension Mother   . Hypertension Maternal Grandmother      Exam    Uterus:     Pelvic Exam:    Perineum: No Hemorrhoids   Vulva: Bartholin's, Urethra, Skene's normal   Vagina:  normal mucosa   pH:    Cervix: no cervical motion tenderness   Adnexa: no mass, fullness, tenderness   Bony Pelvis: gynecoid  System: Breast:  normal appearance, no masses or tenderness   Skin: normal coloration and turgor, no rashes    Neurologic: oriented, grossly non-focal   Extremities: normal strength, tone, and muscle mass   HEENT neck supple with midline trachea   Mouth/Teeth mucous membranes moist, pharynx normal without lesions   Neck supple   Cardiovascular: regular rate and rhythm   Respiratory:  appears well, vitals normal, no respiratory distress, acyanotic, normal RR, ear and throat exam is normal, neck free of mass or lymphadenopathy, chest clear, no wheezing, crepitations, rhonchi, normal symmetric air entry   Abdomen: soft, non-tender; bowel sounds normal; no masses,  no organomegaly   Urinary: urethral meatus normal      Assessment:    Pregnancy: G2P1001 Patient Active Problem List   Diagnosis  Date Noted  . Supervision of other normal pregnancy, antepartum 06/12/2020        Plan:     Initial labs drawn. Continue prenatal vitamins. Discussed and offered genetic screening options, including Quad screen/AFP, NIPS testing, and option to decline testing. Benefits/risks/alternatives reviewed. Pt aware that anatomy 06/14/2020 is form of genetic screening with lower accuracy in detecting trisomies than blood work.  Pt chooses genetic screening today. NIPS: ordered. Ultrasound discussed; fetal anatomic survey: ordered. Problem list reviewed and updated. The nature of [facility] with multiple MDs and other Advanced Practice Providers was explained to patient; also emphasized that residents, students are part of our team. Routine obstetric precautions reviewed.  Will add zofran and protonix for nausea.  Warned about constipation and small risk of birth defects, try phenergan first    Follow up in 4 weeks. 50% of 30 min visit spent on counseling and coordination of care.     [facility] 06/12/2020

## 2020-06-12 NOTE — Progress Notes (Signed)
Patient states she is still nauseated and vomiting. Patient states she hasn't really been able to take the phenergan due to getting sick as soon as she takes it. Armandina Stammer RN

## 2020-06-13 LAB — GC/CHLAMYDIA PROBE AMP (~~LOC~~) NOT AT ARMC
Chlamydia: NEGATIVE
Comment: NEGATIVE
Comment: NORMAL
Neisseria Gonorrhea: NEGATIVE

## 2020-06-14 LAB — URINE CULTURE

## 2020-06-15 LAB — CBC/D/PLT+RPR+RH+ABO+RUB AB...
Antibody Screen: NEGATIVE
Basophils Absolute: 0.1 10*3/uL (ref 0.0–0.2)
Basos: 0 %
EOS (ABSOLUTE): 0.2 10*3/uL (ref 0.0–0.4)
Eos: 1 %
HCV Ab: <0.1 s/co ratio (ref 0.0–0.9)
HIV Screen 4th Generation wRfx: NONREACTIVE
Hematocrit: 37.2 % (ref 34.0–46.6)
Hemoglobin: 12.9 g/dL (ref 11.1–15.9)
Hepatitis B Surface Ag: NEGATIVE
Immature Grans (Abs): 0 10*3/uL (ref 0.0–0.1)
Immature Granulocytes: 0 %
Lymphocytes Absolute: 1.5 10*3/uL (ref 0.7–3.1)
Lymphs: 11 %
MCH: 31 pg (ref 26.6–33.0)
MCHC: 34.7 g/dL (ref 31.5–35.7)
MCV: 89 fL (ref 79–97)
Monocytes Absolute: 1.2 10*3/uL — ABNORMAL HIGH (ref 0.1–0.9)
Monocytes: 8 %
Neutrophils Absolute: 11.7 10*3/uL — ABNORMAL HIGH (ref 1.4–7.0)
Neutrophils: 80 %
Platelets: 355 10*3/uL (ref 150–450)
RBC: 4.16 x10E6/uL (ref 3.77–5.28)
RDW: 12.2 % (ref 11.7–15.4)
RPR Ser Ql: NONREACTIVE
Rh Factor: POSITIVE
Rubella Antibodies, IGG: 5.99 index (ref >0.99)
WBC: 14.7 10*3/uL — ABNORMAL HIGH (ref 3.4–10.8)

## 2020-06-15 LAB — HGB SOLUBILITY: Hgb Solubility: POSITIVE — AB

## 2020-06-15 LAB — HEMOGLOBPATHY+FER W/A THAL RFX
Ferritin: 202 ng/mL — ABNORMAL HIGH (ref 15–150)
Hgb A2: 3.2 % (ref 1.8–3.2)
Hgb A: 55.7 % — ABNORMAL LOW (ref 96.4–98.8)
Hgb F: 0.5 % (ref 0.0–2.0)
Hgb S: 40.6 % — ABNORMAL HIGH

## 2020-06-15 LAB — HCV INTERPRETATION

## 2020-06-19 ENCOUNTER — Encounter: Payer: Self-pay | Admitting: Advanced Practice Midwife

## 2020-06-19 DIAGNOSIS — D573 Sickle-cell trait: Secondary | ICD-10-CM | POA: Insufficient documentation

## 2020-06-25 ENCOUNTER — Inpatient Hospital Stay (HOSPITAL_COMMUNITY)
Admission: AD | Admit: 2020-06-25 | Discharge: 2020-06-25 | Disposition: A | Discharge status: Home / Self Care | Payer: Medicaid Other | Attending: Obstetrics and Gynecology | Admitting: Obstetrics and Gynecology

## 2020-06-25 ENCOUNTER — Encounter (HOSPITAL_COMMUNITY): Payer: Self-pay | Admitting: Obstetrics and Gynecology

## 2020-06-25 ENCOUNTER — Other Ambulatory Visit: Payer: Self-pay

## 2020-06-25 DIAGNOSIS — Z79899 Other long term (current) drug therapy: Secondary | ICD-10-CM | POA: Diagnosis not present

## 2020-06-25 DIAGNOSIS — Z87891 Personal history of nicotine dependence: Secondary | ICD-10-CM | POA: Diagnosis not present

## 2020-06-25 DIAGNOSIS — R109 Unspecified abdominal pain: Secondary | ICD-10-CM | POA: Insufficient documentation

## 2020-06-25 DIAGNOSIS — O210 Mild hyperemesis gravidarum: Secondary | ICD-10-CM

## 2020-06-25 DIAGNOSIS — O26891 Other specified pregnancy related conditions, first trimester: Secondary | ICD-10-CM | POA: Diagnosis not present

## 2020-06-25 DIAGNOSIS — Z3A10 10 weeks gestation of pregnancy: Secondary | ICD-10-CM | POA: Diagnosis not present

## 2020-06-25 LAB — URINALYSIS, ROUTINE W REFLEX MICROSCOPIC
Bilirubin Urine: NEGATIVE
Glucose, UA: NEGATIVE mg/dL
Hgb urine dipstick: NEGATIVE
Ketones, ur: NEGATIVE mg/dL
Leukocytes,Ua: NEGATIVE
Nitrite: NEGATIVE
Protein, ur: NEGATIVE mg/dL
Specific Gravity, Urine: 1.013 (ref 1.005–1.030)
pH: 5 (ref 5.0–8.0)

## 2020-06-25 NOTE — MAU Note (Signed)
Pt reports she started Zofran about a week ago and it has worked and she has not vomited for about a week until this afternoon and she vomited two times and both times there was blood in it. Mild stomach cramping which she says it normal for her

## 2020-06-25 NOTE — MAU Provider Note (Signed)
History     CSN: [id number]  Arrival date and time: 06/25/20 2201   First Provider Initiated Contact with Patient 06/25/20 2259      Chief Complaint  Patient presents with  . Hematemesis   [name] is a 24 y.o. year old G81P1001 female at [redacted]w[redacted]d weeks gestation who presents to MAU reporting she started take Zofran about 1 week ago and has not vomited in about a week. Tonight she had burger and fries (usual dietary choices for her) and vomited twice right after that at ~2130. She has not tried to drink anything since she vomited. She last took Zofran yesterday. She took Protonix this morning. She also reports mild abdominal cramping; which is normal per the patient. She receives her prenatal care with [facility].   OB History    Gravida  2   Para  1   Term  1   Preterm      AB      Living  1     SAB      TAB      Ectopic      Multiple      Live Births  1           Past Medical History:  Diagnosis Date  . Anemia     History reviewed. No pertinent surgical history.  Family History  Problem Relation Age of Onset  . Hypertension Mother   . Hypertension Maternal Grandmother     Social History   Tobacco Use  . Smoking status: Former Games developer  . Smokeless tobacco: Former Clinical biochemist  . Vaping Use: Never used  Substance Use Topics  . Alcohol use: Not Currently  . Drug use: Never    Allergies: No Known Allergies  Medications Prior to Admission  Medication Sig Dispense Refill Last Dose  . metroNIDAZOLE (METROGEL VAGINAL) 0.75 % vaginal gel Place 1 Applicatorful vaginally 2 (two) times daily. (Patient not taking: Reported on 06/12/2020) 70 g 0   . ondansetron (ZOFRAN ODT) 4 MG disintegrating tablet Take 1 tablet (4 mg total) by mouth every 6 (six) hours as needed for nausea. 20 tablet 0   . pantoprazole (PROTONIX) 20 MG tablet Take 1 tablet (20 mg total) by mouth daily. 30 tablet 0   . Prenatal Vit-Fe Fumarate-FA (PRENATAL MULTIVITAMIN) TABS  tablet Take 1 tablet by mouth daily at 12 noon.     . promethazine (PHENERGAN) 25 MG tablet Take 1 tablet (25 mg total) by mouth every 6 (six) hours as needed for nausea or vomiting. 30 tablet 2     Review of Systems  Constitutional: Negative.   HENT: Negative.   Eyes: Negative.   Respiratory: Negative.   Cardiovascular: Negative.   Gastrointestinal: Positive for nausea and vomiting.  Endocrine: Negative.   Genitourinary: Negative.   Musculoskeletal: Negative.   Skin: Negative.   Allergic/Immunologic: Negative.   Neurological: Negative.   Hematological: Negative.   Psychiatric/Behavioral: Negative.    Physical Exam   Blood pressure 125/67, pulse 79, temperature 99 F (37.2 C), temperature source Oral, resp. rate 17, height 5\' 4"  (1.626 m), weight 62.1 kg, last menstrual period 04/10/2020, SpO2 100 %.  Physical Exam Vitals and nursing note reviewed.  Constitutional:      Appearance: Normal appearance. She is normal weight.  HENT:     Head: Normocephalic and atraumatic.  Cardiovascular:     Rate and Rhythm: Normal rate.  Pulmonary:     Effort: Pulmonary effort is normal.  Abdominal:     General: Abdomen is flat.  Genitourinary:    Comments: Not indicated Musculoskeletal:     Cervical back: Normal range of motion.  Skin:    General: Skin is warm and dry.  Neurological:     Mental Status: She is alert and oriented to person, place, and time.  Psychiatric:        Mood and Affect: Mood normal.        Behavior: Behavior normal.        Thought Content: Thought content normal.        Judgment: Judgment normal.    FHTs by doppler: 168 bpm  MAU Course  Procedures  MDM [facility] Offered Zofran -- patient declined stating that she can take that at home PO hydration with water -- well tolerated  Results for orders placed or performed during the hospital encounter of 06/25/20 (from the past 24 hour(s))  Urinalysis, Routine w reflex microscopic     Status: Abnormal    Collection Time: 06/25/20 10:30 PM  Result Value Ref Range   Color, Urine YELLOW YELLOW   APPearance HAZY (A) CLEAR   Specific Gravity, Urine 1.013 1.005 - 1.030   pH 5.0 5.0 - 8.0   Glucose, UA NEGATIVE NEGATIVE mg/dL   Hgb urine dipstick NEGATIVE NEGATIVE   Bilirubin Urine NEGATIVE NEGATIVE   Ketones, ur NEGATIVE NEGATIVE mg/dL   Protein, ur NEGATIVE NEGATIVE mg/dL   Nitrite NEGATIVE NEGATIVE   Leukocytes,Ua NEGATIVE NEGATIVE    Assessment and Plan  Morning sickness - Plan: Discharge patient - Reassurance that some blood tinge in emesis is normal variation that is caused by irritation of esophagus with frequent vomiting - Advised to continue current anti-emetic plan at home - Keep scheduled appt with [facility] on 07/10/2020 - Patient verbalized an understanding of the plan of care and agrees.   Raelyn Mora, MSN, CNM 06/25/2020, 10:59 PM

## 2020-06-25 NOTE — Discharge Instructions (Signed)

## 2020-07-10 ENCOUNTER — Other Ambulatory Visit: Payer: Self-pay | Admitting: Advanced Practice Midwife

## 2020-07-10 ENCOUNTER — Ambulatory Visit (INDEPENDENT_AMBULATORY_CARE_PROVIDER_SITE_OTHER): Payer: Medicaid Other | Admitting: Advanced Practice Midwife

## 2020-07-10 ENCOUNTER — Other Ambulatory Visit: Payer: Self-pay

## 2020-07-10 ENCOUNTER — Encounter: Payer: Self-pay | Admitting: Advanced Practice Midwife

## 2020-07-10 VITALS — BP 113/68 | HR 83 | Wt 137.0 lb

## 2020-07-10 DIAGNOSIS — Z23 Encounter for immunization: Secondary | ICD-10-CM

## 2020-07-10 DIAGNOSIS — R112 Nausea with vomiting, unspecified: Secondary | ICD-10-CM

## 2020-07-10 DIAGNOSIS — Z3A13 13 weeks gestation of pregnancy: Secondary | ICD-10-CM

## 2020-07-10 DIAGNOSIS — O219 Vomiting of pregnancy, unspecified: Secondary | ICD-10-CM

## 2020-07-10 DIAGNOSIS — Z3480 Encounter for supervision of other normal pregnancy, unspecified trimester: Secondary | ICD-10-CM

## 2020-07-10 MED ORDER — ONDANSETRON 4 MG PO TBDP: 4.0000 mg | ORAL_TABLET | Freq: Four times a day (QID) | ORAL | 0 refills | Status: DC | PRN

## 2020-07-10 NOTE — Patient Instructions (Signed)

## 2020-07-10 NOTE — Progress Notes (Signed)
   PRENATAL VISIT NOTE  Subjective:  Amanda Howe is a 24 y.o. G2P1001 at [redacted]w[redacted]d being seen today for ongoing prenatal care.  She is currently monitored for the following issues for this low-risk pregnancy and has Supervision of other normal pregnancy, antepartum and Sickle cell trait (HCC) on their problem list.  Patient reports occasional nausea.  Contractions: Not present. Vag. Bleeding: None.  Movement: Absent. Denies leaking of fluid.   The following portions of the patient's history were reviewed and updated as appropriate: allergies, current medications, past family history, past medical history, past social history, past surgical history and problem list.   Objective:   Vitals:   07/10/20 0914 07/10/20 0918  BP: (!) 121/92 113/68  Pulse: (!) 104 83  Weight: 137 lb (62.1 kg)     Fetal Status: Fetal Heart Rate (bpm): 162 Fundal Height: 13 cm Movement: Absent     General:  Alert, oriented and cooperative. Patient is in no acute distress.  Skin: Skin is warm and dry. No rash noted.   Cardiovascular: Normal heart rate noted  Respiratory: Normal respiratory effort, no problems with respiration noted  Abdomen: Soft, gravid, appropriate for gestational age.  Pain/Pressure: Absent     Pelvic: Cervical exam deferred        Extremities: Normal range of motion.  Edema: None  Mental Status: Normal mood and affect. Normal behavior. Normal judgment and thought content.   Assessment and Plan:  Pregnancy: G2P1001 at [redacted]w[redacted]d 1. [redacted] weeks gestation of pregnancy  - Flu Vaccine QUAD 36+ mos IM - Genetic Screening - Cystic fibrosis gene test  2. Supervision of other normal pregnancy, antepartum  - Cystic fibrosis gene test  3. Non-intractable vomiting with nausea, unspecified vomiting type Rx Zofran sent   Only occasional now  - ondansetron (ZOFRAN ODT) 4 MG disintegrating tablet; Take 1 tablet (4 mg total) by mouth every 6 (six) hours as needed for nausea.  Dispense: 20 tablet; Refill:  0  Preterm labor symptoms and general obstetric precautions including but not limited to vaginal bleeding, contractions, leaking of fluid and fetal movement were reviewed in detail with the patient. Please refer to After Visit Summary for other counseling recommendations.   Return in about 7 weeks (around 08/28/2020) for [employer].  Future Appointments  Date Time Provider Department Center  08/21/2020  9:45 AM [facility]  08/28/2020  9:30 AM Aviva Signs, CNM [facility] None    Wynelle Bourgeois, CNM

## 2020-07-14 LAB — CYSTIC FIBROSIS GENE TEST

## 2020-07-18 ENCOUNTER — Telehealth: Payer: Self-pay

## 2020-07-18 NOTE — Telephone Encounter (Signed)
Called patient and left message for her to return call to office. Was going to give her Panorama results. Armandina Stammer, RN

## 2020-07-19 ENCOUNTER — Other Ambulatory Visit: Payer: Self-pay

## 2020-07-31 ENCOUNTER — Other Ambulatory Visit: Payer: Self-pay

## 2020-07-31 MED ORDER — VITAFOL GUMMIES 3.33-0.333-34.8 MG PO CHEW: 1.0000 | CHEWABLE_TABLET | Freq: Every day | ORAL | 3 refills | Status: DC

## 2020-07-31 NOTE — Progress Notes (Signed)
Patient called and requested prenatal vitamin. Armandina Stammer RN

## 2020-08-07 ENCOUNTER — Telehealth: Payer: Self-pay

## 2020-08-07 ENCOUNTER — Other Ambulatory Visit: Payer: Self-pay

## 2020-08-07 ENCOUNTER — Inpatient Hospital Stay (HOSPITAL_COMMUNITY)
Admission: AD | Admit: 2020-08-07 | Discharge: 2020-08-07 | Disposition: A | Discharge status: Home / Self Care | Payer: Medicaid Other | Attending: Obstetrics and Gynecology | Admitting: Obstetrics and Gynecology

## 2020-08-07 ENCOUNTER — Encounter (HOSPITAL_COMMUNITY): Payer: Self-pay | Admitting: Obstetrics and Gynecology

## 2020-08-07 DIAGNOSIS — O26892 Other specified pregnancy related conditions, second trimester: Secondary | ICD-10-CM | POA: Diagnosis present

## 2020-08-07 DIAGNOSIS — Z3A17 17 weeks gestation of pregnancy: Secondary | ICD-10-CM | POA: Diagnosis not present

## 2020-08-07 DIAGNOSIS — O26852 Spotting complicating pregnancy, second trimester: Secondary | ICD-10-CM

## 2020-08-07 DIAGNOSIS — R109 Unspecified abdominal pain: Secondary | ICD-10-CM

## 2020-08-07 DIAGNOSIS — O4692 Antepartum hemorrhage, unspecified, second trimester: Secondary | ICD-10-CM | POA: Diagnosis not present

## 2020-08-07 DIAGNOSIS — Z711 Person with feared health complaint in whom no diagnosis is made: Secondary | ICD-10-CM

## 2020-08-07 HISTORY — DX: Unspecified ovarian cyst, unspecified side: N83.209

## 2020-08-07 LAB — URINALYSIS, ROUTINE W REFLEX MICROSCOPIC
Bilirubin Urine: NEGATIVE
Glucose, UA: NEGATIVE mg/dL
Hgb urine dipstick: NEGATIVE
Ketones, ur: NEGATIVE mg/dL
Nitrite: NEGATIVE
Protein, ur: NEGATIVE mg/dL
Specific Gravity, Urine: 1.006 (ref 1.005–1.030)
pH: 6 (ref 5.0–8.0)

## 2020-08-07 NOTE — Telephone Encounter (Signed)
Patient states that she has been feeling movement and on Friday she states her toddler jumped on her belly. Patient states like thirty minutes after he did that baby moved but she hasnt felt anything since Friday (four days ago). Patient is 17 weeks today and states she has been feeling movements for a little while now.patient denies any bleeding. Patient advised since it has been four days since she last felt movement to go to Maternity admissions unit at [facility] for evaluation. Patient states understanding.

## 2020-08-07 NOTE — Discharge Instructions (Signed)
Vaginal Bleeding During Pregnancy, Second Trimester  A small amount of bleeding (spotting) from the vagina is common during pregnancy. Sometimes the bleeding is normal and is not a sign of problems. In some other cases, it is a sign of something serious. Tell your doctor right away if there is any bleeding from your vagina. Follow these instructions at home: Activity  Follow your doctor's instructions about how active you can be.  If needed, make plans for someone to help with your normal activities.  Do not exercise or do activities that take a lot of effort until your doctor says that this is safe.  Do not lift anything that is heavier than 10 lb (4.5 kg) until your doctor says that this is safe.  Do not have sex or orgasms until your doctor says that this is safe. Medicines  Take over-the-counter and prescription medicines only as told by your doctor.  Do not take aspirin. It can cause bleeding. General instructions  Watch your condition for any changes.  Write down: ? The number of pads you use each day. ? How often you change pads. ? How soaked your pads are.  Do not use tampons.  Do not douche.  If you pass any tissue from your vagina, save it to show to your doctor.  Keep all follow-up visits as told by your doctor. This is important. Contact a doctor if:  You have bleeding in the vagina at any time during pregnancy.  You have cramps.  You have a fever that does not get better with medicine. Get help right away if:  You have very bad cramps in your back or belly (abdomen).  You have contractions.  You have chills.  You pass large clots or a lot of tissue from your vagina.  Your bleeding gets worse.  You feel light-headed.  You feel weak.  You pass out (faint).  You are leaking fluid from your vagina.  You have a gush of fluid from your vagina. Summary  Sometimes vaginal bleeding during pregnancy is normal and is not a problem. Sometimes it may  be a sign of something serious.  Tell your doctor about any bleeding from your vagina right away.  Follow your doctor's instructions about how active you can be. You may need someone to help you with your normal activities. This information is not intended to replace advice given to you by your health care provider. Make sure you discuss any questions you have with your health care provider. Document Revised: 01/04/2019 Document Reviewed: 12/17/2016 Elsevier Patient Education  2020 [employer]  

## 2020-08-07 NOTE — MAU Provider Note (Signed)
First Provider Initiated Contact with Patient 08/07/20 1813     S [name] is a 24 y.o. G2P1001 at [redacted]w[redacted]d  who presents to MAU today with a complaint of mild abdominal cramping and an episode of spotting over the weekend. Her son (6) was trying to sit in her lap on Friday and accidentally jumped on to her belly. Following that incident she has had the mild cramping (although similar to what she's had throughout the pregnancy) and had an episode of light pink spotting on Saturday. She is not spotting anymore and denies any other vaginal discharge.   O BP 114/60 (BP Location: Right Arm)   Pulse 79   Temp 98.9 F (37.2 C) (Oral)   Resp 18   Ht 5\' 4"  (1.626 m)   Wt 142 lb (64.4 kg)   LMP 04/10/2020   SpO2 100%   BMI 24.37 kg/m   FHR: 153 Physical Exam Vitals and nursing note reviewed. Exam conducted with a chaperone present.  Constitutional:      General: She is not in acute distress.    Appearance: She is well-developed and normal weight. She is not ill-appearing.  Cardiovascular:     Rate and Rhythm: Normal rate and regular rhythm.  Pulmonary:     Effort: Pulmonary effort is normal.  Abdominal:     General: Bowel sounds are normal. There is no distension. There are no signs of injury.     Palpations: Abdomen is soft.     Tenderness: There is no abdominal tenderness.     Hernia: No hernia is present.     Comments: gravid  Genitourinary:    Uterus: Normal. Not tender.      Adnexa: Right adnexa normal and left adnexa normal.       Right: No tenderness.         Left: No tenderness.    Skin:    General: Skin is warm and dry.     Capillary Refill: Capillary refill takes less than 2 seconds.  Neurological:     Mental Status: She is alert and oriented to person, place, and time.  Psychiatric:        Mood and Affect: Mood normal.        Behavior: Behavior normal.    A Physically well but worried at [redacted] weeks gestation Abdominal cramping in the second trimester Vaginal  spotting in the second trimester Medical screening exam complete  P Discharge from MAU in stable condition with return precautions Follow up at [facility] as scheduled, anatomy scan on 08/21/20 Warning signs for worsening condition that would warrant emergency follow-up discussed Patient may return to MAU as needed for emergent OB/GYN related complaints  08/23/20, CNM 08/07/2020 6:46 PM

## 2020-08-07 NOTE — MAU Note (Signed)
Last Friday, son jumped on her stomach. Felt movement after that same day.  Saturday, felt a sharp pain in her back, it went away.  Baby hasn't moved since Friday.  Had some light bleeding on Sunday.  Started cramping today.

## 2020-08-21 ENCOUNTER — Other Ambulatory Visit: Payer: Self-pay | Admitting: *Deleted

## 2020-08-21 ENCOUNTER — Other Ambulatory Visit: Payer: Self-pay | Admitting: Advanced Practice Midwife

## 2020-08-21 ENCOUNTER — Encounter: Payer: Self-pay | Admitting: Advanced Practice Midwife

## 2020-08-21 ENCOUNTER — Other Ambulatory Visit: Payer: Self-pay

## 2020-08-21 ENCOUNTER — Ambulatory Visit: Payer: Medicaid Other | Attending: Advanced Practice Midwife

## 2020-08-21 DIAGNOSIS — Z362 Encounter for other antenatal screening follow-up: Secondary | ICD-10-CM

## 2020-08-21 DIAGNOSIS — Z3A09 9 weeks gestation of pregnancy: Secondary | ICD-10-CM | POA: Diagnosis not present

## 2020-08-21 DIAGNOSIS — Z3480 Encounter for supervision of other normal pregnancy, unspecified trimester: Secondary | ICD-10-CM | POA: Diagnosis not present

## 2020-08-21 DIAGNOSIS — O283 Abnormal ultrasonic finding on antenatal screening of mother: Secondary | ICD-10-CM | POA: Insufficient documentation

## 2020-08-28 ENCOUNTER — Ambulatory Visit (INDEPENDENT_AMBULATORY_CARE_PROVIDER_SITE_OTHER): Payer: Medicaid Other | Admitting: Advanced Practice Midwife

## 2020-08-28 ENCOUNTER — Other Ambulatory Visit: Payer: Self-pay

## 2020-08-28 VITALS — BP 110/73 | HR 80 | Wt 150.0 lb

## 2020-08-28 DIAGNOSIS — O26832 Pregnancy related renal disease, second trimester: Secondary | ICD-10-CM

## 2020-08-28 DIAGNOSIS — Z3A20 20 weeks gestation of pregnancy: Secondary | ICD-10-CM

## 2020-08-28 DIAGNOSIS — Z3480 Encounter for supervision of other normal pregnancy, unspecified trimester: Secondary | ICD-10-CM

## 2020-08-28 DIAGNOSIS — N200 Calculus of kidney: Secondary | ICD-10-CM

## 2020-08-28 MED ORDER — PRENATAL VITAMIN 27-0.8 MG PO TABS: 1.0000 | ORAL_TABLET | Freq: Every day | ORAL | 2 refills | Status: AC

## 2020-08-28 NOTE — Patient Instructions (Signed)
Second Trimester of Pregnancy  The second trimester is from week 14 through week 27 (month 4 through 6). This is often the time in pregnancy that you feel your best. Often times, morning sickness has lessened or quit. You may have more energy, and you may get hungry more often. Your unborn baby is growing rapidly. At the end of the sixth month, he or she is about 9 inches long and weighs about 1 pounds. You will likely feel the baby move between 18 and 20 weeks of pregnancy. Follow these instructions at home: Medicines  Take over-the-counter and prescription medicines only as told by your doctor. Some medicines are safe and some medicines are not safe during pregnancy.  Take a prenatal vitamin that contains at least 600 micrograms (mcg) of folic acid.  If you have trouble pooping (constipation), take medicine that will make your stool soft (stool softener) if your doctor approves. Eating and drinking   Eat regular, healthy meals.  Avoid raw meat and uncooked cheese.  If you get low calcium from the food you eat, talk to your doctor about taking a daily calcium supplement.  Avoid foods that are high in fat and sugars, such as fried and sweet foods.  If you feel sick to your stomach (nauseous) or throw up (vomit): ? Eat 4 or 5 small meals a day instead of 3 large meals. ? Try eating a few soda crackers. ? Drink liquids between meals instead of during meals.  To prevent constipation: ? Eat foods that are high in fiber, like fresh fruits and vegetables, whole grains, and beans. ? Drink enough fluids to keep your pee (urine) clear or pale yellow. Activity  Exercise only as told by your doctor. Stop exercising if you start to have cramps.  Do not exercise if it is too hot, too humid, or if you are in a place of great height (high altitude).  Avoid heavy lifting.  Wear low-heeled shoes. Sit and stand up straight.  You can continue to have sex unless your doctor tells you not  to. Relieving pain and discomfort  Wear a good support bra if your breasts are tender.  Take warm water baths (sitz baths) to soothe pain or discomfort caused by hemorrhoids. Use hemorrhoid cream if your doctor approves.  Rest with your legs raised if you have leg cramps or low back pain.  If you develop puffy, bulging veins (varicose veins) in your legs: ? Wear support hose or compression stockings as told by your doctor. ? Raise (elevate) your feet for 15 minutes, 3-4 times a day. ? Limit salt in your food. Prenatal care  Write down your questions. Take them to your prenatal visits.  Keep all your prenatal visits as told by your doctor. This is important. Safety  Wear your seat belt when driving.  Make a list of emergency phone numbers, including numbers for family, friends, the hospital, and police and fire departments. General instructions  Ask your doctor about the right foods to eat or for help finding a counselor, if you need these services.  Ask your doctor about local prenatal classes. Begin classes before month 6 of your pregnancy.  Do not use hot tubs, steam rooms, or saunas.  Do not douche or use tampons or scented sanitary pads.  Do not cross your legs for long periods of time.  Visit your dentist if you have not done so. Use a soft toothbrush to brush your teeth. Floss gently.  Avoid all smoking, herbs,   and alcohol. Avoid drugs that are not approved by your doctor.  Do not use any products that contain nicotine or tobacco, such as cigarettes and e-cigarettes. If you need help quitting, ask your doctor.  Avoid cat litter boxes and soil used by cats. These carry germs that can cause birth defects in the baby and can cause a loss of your baby (miscarriage) or stillbirth. Contact a doctor if:  You have mild cramps or pressure in your lower belly.  You have pain when you pee (urinate).  You have bad smelling fluid coming from your vagina.  You continue to  feel sick to your stomach (nauseous), throw up (vomit), or have watery poop (diarrhea).  You have a nagging pain in your belly area.  You feel dizzy. Get help right away if:  You have a fever.  You are leaking fluid from your vagina.  You have spotting or bleeding from your vagina.  You have severe belly cramping or pain.  You lose or gain weight rapidly.  You have trouble catching your breath and have chest pain.  You notice sudden or extreme puffiness (swelling) of your face, hands, ankles, feet, or legs.  You have not felt the baby move in over an hour.  You have severe headaches that do not go away when you take medicine.  You have trouble seeing. Summary  The second trimester is from week 14 through week 27 (months 4 through 6). This is often the time in pregnancy that you feel your best.  To take care of yourself and your unborn baby, you will need to eat healthy meals, take medicines only if your doctor tells you to do so, and do activities that are safe for you and your baby.  Call your doctor if you get sick or if you notice anything unusual about your pregnancy. Also, call your doctor if you need help with the right food to eat, or if you want to know what activities are safe for you. This information is not intended to replace advice given to you by your health care provider. Make sure you discuss any questions you have with your health care provider. Document Revised: 01/07/2019 Document Reviewed: 10/21/2016 Elsevier Patient Education  2020 [employer] Kidney Stones  Kidney stones are solid, rock-like deposits that form inside of the kidneys. The kidneys are a pair of organs that make urine. A kidney stone may form in a kidney and move into other parts of the urinary tract, including the tubes that connect the kidneys to the bladder (ureters), the bladder, and the tube that carries urine out of the body (urethra). As the stone moves through these areas, it can  cause intense pain and block the flow of urine. Kidney stones are created when high levels of certain minerals are found in the urine. The stones are usually passed out of the body through urination, but in some cases, medical treatment may be needed to remove them. What are the causes? Kidney stones may be caused by:  A condition in which certain glands produce too much parathyroid hormone (primary hyperparathyroidism), which causes too much calcium buildup in the blood.  A buildup of uric acid crystals in the bladder (hyperuricosuria). Uric acid is a chemical that the body produces when you eat certain foods. It usually exits the body in the urine.  Narrowing (stricture) of one or both of the ureters.  A kidney blockage that is present at birth (congenital obstruction).  Past surgery on the  kidney or the ureters, such as gastric bypass surgery. What increases the risk? The following factors may make you more likely to develop this condition:  Having had a kidney stone in the past.  Having a family history of kidney stones.  Not drinking enough water.  Eating a diet that is high in protein, salt (sodium), or sugar.  Being overweight or obese. What are the signs or symptoms? Symptoms of a kidney stone may include:  Pain in the side of the abdomen, right below the ribs (flank pain). Pain usually spreads (radiates) to the groin.  Needing to urinate frequently or urgently.  Painful urination.  Blood in the urine (hematuria).  Nausea.  Vomiting.  Fever and chills. How is this diagnosed? This condition may be diagnosed based on:  Your symptoms and medical history.  A physical exam.  Blood tests.  Urine tests. These may be done before and after the stone passes out of your body through urination.  Imaging tests, such as a CT scan, abdominal X-ray, or ultrasound.  A procedure to examine the inside of the bladder (cystoscopy). How is this treated? Treatment for kidney  stones depends on the size, location, and makeup of the stones. Kidney stones will often pass out of the body through urination. You may need to:  Increase your fluid intake to help pass the stone. In some cases, you may be given fluids through an IV and may need to be monitored at the hospital.  Take medicine for pain.  Make changes in your diet to help prevent kidney stones from coming back. Sometimes, medical procedures are needed to remove a kidney stone. This may involve:  A procedure to break up kidney stones using: ? A focused beam of light (laser therapy). ? Shock waves (extracorporeal shock wave lithotripsy).  Surgery to remove kidney stones. This may be needed if you have severe pain or have stones that block your urinary tract. Follow these instructions at home: Medicines  Take over-the-counter and prescription medicines only as told by your health care provider.  Ask your health care provider if the medicine prescribed to you requires you to avoid driving or using heavy machinery. Eating and drinking  Drink enough fluid to keep your urine pale yellow. You may be instructed to drink at least 8-10 glasses of water each day. This will help you pass the kidney stone.  If directed, change your diet. This may include: ? Limiting how much sodium you eat. ? Eating more fruits and vegetables. ? Limiting how much animal protein--such as red meat, poultry, fish, and eggs--you eat.  Follow instructions from your health care provider about eating or drinking restrictions. General instructions  Collect urine samples as told by your health care provider. You may need to collect a urine sample: ? 24 hours after you pass the stone. ? 8-12 weeks after passing the kidney stone, and every 6-12 months after that.  Strain your urine every time you urinate, for as long as directed. Use the strainer that your health care provider recommends.  Do not throw out the kidney stone after passing  it. Keep the stone so it can be tested by your health care provider. Testing the makeup of your kidney stone may help prevent you from getting kidney stones in the future.  Keep all follow-up visits as told by your health care provider. This is important. You may need follow-up X-rays or ultrasounds to make sure that your stone has passed. How is this prevented? To  prevent another kidney stone:  Drink enough fluid to keep your urine pale yellow. This is the best way to prevent kidney stones.  Eat a healthy diet and follow recommendations from your health care provider about foods to avoid. You may be instructed to eat a low-protein diet. Recommendations vary depending on the type of kidney stone that you have.  Maintain a healthy weight. Where to find more information  National Kidney Foundation (NKF): www.kidney.org  [facility]): www.urologyhealth.org Contact a health care provider if:  You have pain that gets worse or does not get better with medicine. Get help right away if:  You have a fever or chills.  You develop severe pain.  You develop new abdominal pain.  You faint.  You are unable to urinate. Summary  Kidney stones are solid, rock-like deposits that form inside of the kidneys.  Kidney stones can cause nausea, vomiting, blood in the urine, abdominal pain, and the urge to urinate frequently.  Treatment for kidney stones depends on the size, location, and makeup of the stones. Kidney stones will often pass out of the body through urination.  Kidney stones can be prevented by drinking enough fluids, eating a healthy diet, and maintaining a healthy weight. This information is not intended to replace advice given to you by your health care provider. Make sure you discuss any questions you have with your health care provider. Document Revised: 02/01/2019 Document Reviewed: 02/01/2019 Elsevier Patient Education  2020 [employer].

## 2020-08-28 NOTE — Progress Notes (Signed)
Recent treatment for kidney stone. Armandina Stammer RN

## 2020-08-28 NOTE — Progress Notes (Signed)
   PRENATAL VISIT NOTE  Subjective:  Amanda Howe is a 24 y.o. G2P1001 at [redacted]w[redacted]d being seen today for ongoing prenatal care.  She is currently monitored for the following issues for this low-risk pregnancy and has Supervision of other normal pregnancy, antepartum; Sickle cell trait (HCC); Abnormal fetal ultrasound; and Kidney stone complicating pregnancy on their problem list.  Patient reports no complaints.  Contractions: Not present. Vag. Bleeding: None.  Movement: Present. Denies leaking of fluid.   The following portions of the patient's history were reviewed and updated as appropriate: allergies, current medications, past family history, past medical history, past social history, past surgical history and problem list.   Objective:   Vitals:   08/28/20 0944  BP: 110/73  Pulse: 80  Weight: 150 lb (68 kg)    Fetal Status: Fetal Heart Rate (bpm): 155 Fundal Height: 20 cm Movement: Present     General:  Alert, oriented and cooperative. Patient is in no acute distress.  Skin: Skin is warm and dry. No rash noted.   Cardiovascular: Normal heart rate noted  Respiratory: Normal respiratory effort, no problems with respiration noted  Abdomen: Soft, gravid, appropriate for gestational age.  Pain/Pressure: Absent     Pelvic: Cervical exam deferred        Extremities: Normal range of motion.  Edema: None  Mental Status: Normal mood and affect. Normal behavior. Normal judgment and thought content.   Assessment and Plan:  Pregnancy: G2P1001 at [redacted]w[redacted]d 1. [redacted] weeks gestation of pregnancy     Anatomy US normal but needs another to complete scan  2. Supervision of other normal pregnancy, antepartum    3. Calculus of kidney affecting pregnancy in second trimester     Went to ED with stone, passed on her own.  First one ever. Discussed hydration, signs of another. If recurs will refer to urology.  Preterm labor symptoms and general obstetric precautions including but not limited to vaginal  bleeding, contractions, leaking of fluid and fetal movement were reviewed in detail with the patient. Please refer to After Visit Summary for other counseling recommendations.     Future Appointments  Date Time Provider Department Center  09/17/2020  8:45 AM [facility] US[facility]  10/02/2020  8:25 AM Aviva Signs, CNM [facility] None    Wynelle Bourgeois, CNM

## 2020-09-17 ENCOUNTER — Ambulatory Visit: Payer: Medicaid Other | Attending: Obstetrics and Gynecology

## 2020-09-17 ENCOUNTER — Other Ambulatory Visit: Payer: Self-pay

## 2020-09-17 DIAGNOSIS — O358XX0 Maternal care for other (suspected) fetal abnormality and damage, not applicable or unspecified: Secondary | ICD-10-CM | POA: Diagnosis not present

## 2020-09-17 DIAGNOSIS — Z3A22 22 weeks gestation of pregnancy: Secondary | ICD-10-CM | POA: Diagnosis not present

## 2020-09-17 DIAGNOSIS — Z362 Encounter for other antenatal screening follow-up: Secondary | ICD-10-CM | POA: Diagnosis present

## 2020-09-29 ENCOUNTER — Other Ambulatory Visit: Payer: Self-pay

## 2020-09-29 ENCOUNTER — Observation Stay (HOSPITAL_COMMUNITY)
Admission: AD | Admit: 2020-09-29 | Discharge: 2020-09-30 | Disposition: A | Discharge status: Home / Self Care | Payer: Medicaid Other | Attending: Obstetrics & Gynecology | Admitting: Obstetrics & Gynecology

## 2020-09-29 ENCOUNTER — Encounter (HOSPITAL_COMMUNITY): Payer: Self-pay | Admitting: Obstetrics & Gynecology

## 2020-09-29 DIAGNOSIS — Z87891 Personal history of nicotine dependence: Secondary | ICD-10-CM | POA: Insufficient documentation

## 2020-09-29 DIAGNOSIS — O26892 Other specified pregnancy related conditions, second trimester: Secondary | ICD-10-CM | POA: Diagnosis present

## 2020-09-29 DIAGNOSIS — U071 COVID-19: Secondary | ICD-10-CM | POA: Insufficient documentation

## 2020-09-29 DIAGNOSIS — R55 Syncope and collapse: Secondary | ICD-10-CM

## 2020-09-29 DIAGNOSIS — Z3480 Encounter for supervision of other normal pregnancy, unspecified trimester: Secondary | ICD-10-CM

## 2020-09-29 DIAGNOSIS — O98512 Other viral diseases complicating pregnancy, second trimester: Secondary | ICD-10-CM | POA: Diagnosis not present

## 2020-09-29 DIAGNOSIS — Z3A24 24 weeks gestation of pregnancy: Secondary | ICD-10-CM | POA: Diagnosis not present

## 2020-09-29 LAB — CBC
HCT: 33.4 % — ABNORMAL LOW (ref 36.0–46.0)
Hemoglobin: 11.8 g/dL — ABNORMAL LOW (ref 12.0–15.0)
MCH: 30.8 pg (ref 26.0–34.0)
MCHC: 35.3 g/dL (ref 30.0–36.0)
MCV: 87.2 fL (ref 80.0–100.0)
Platelets: 231 10*3/uL (ref 150–400)
RBC: 3.83 MIL/uL — ABNORMAL LOW (ref 3.87–5.11)
RDW: 12.8 % (ref 11.5–15.5)
WBC: 9.9 10*3/uL (ref 4.0–10.5)
nRBC: 0 % (ref 0.0–0.2)

## 2020-09-29 LAB — URINALYSIS, ROUTINE W REFLEX MICROSCOPIC
Bilirubin Urine: NEGATIVE
Glucose, UA: NEGATIVE mg/dL
Hgb urine dipstick: NEGATIVE
Ketones, ur: NEGATIVE mg/dL
Leukocytes,Ua: NEGATIVE
Nitrite: NEGATIVE
Protein, ur: NEGATIVE mg/dL
Specific Gravity, Urine: 1.008 (ref 1.005–1.030)
pH: 7 (ref 5.0–8.0)

## 2020-09-29 LAB — COMPREHENSIVE METABOLIC PANEL
ALT: 22 U/L (ref 0–44)
AST: 25 U/L (ref 15–41)
Albumin: 3.1 g/dL — ABNORMAL LOW (ref 3.5–5.0)
Alkaline Phosphatase: 62 U/L (ref 38–126)
Anion gap: 9 (ref 5–15)
BUN: 8 mg/dL (ref 6–20)
CO2: 24 mmol/L (ref 22–32)
Calcium: 8.9 mg/dL (ref 8.9–10.3)
Chloride: 104 mmol/L (ref 98–111)
Creatinine, Ser: 0.85 mg/dL (ref 0.44–1.00)
GFR, Estimated: >60 mL/min (ref >60)
Glucose, Bld: 87 mg/dL (ref 70–99)
Potassium: 4.3 mmol/L (ref 3.5–5.1)
Sodium: 137 mmol/L (ref 135–145)
Total Bilirubin: 0.4 mg/dL (ref 0.3–1.2)
Total Protein: 6.6 g/dL (ref 6.5–8.1)

## 2020-09-29 MED ORDER — ONDANSETRON 4 MG PO TBDP: 8.0000 mg | ORAL_TABLET | Freq: Once | ORAL | Status: DC

## 2020-09-29 MED ORDER — CYCLOBENZAPRINE HCL 5 MG PO TABS: 10.0000 mg | ORAL_TABLET | Freq: Once | ORAL | Status: DC

## 2020-09-29 NOTE — MAU Note (Signed)
..  Amanda Howe is a 25 y.o. at [redacted]w[redacted]d here in MAU reporting: episodes of fainting that she has no recollection of. Patient reports her boyfriend found her on the floor about an hour ago. She does not know if she hit herself or if she fell on the floor. She states her left side hurts as if she fell.  Denies vaginal bleeding or contractions. +FM  Pain score: 5/10 Vitals:   09/29/20 2214  BP: 111/61  Pulse: 82  Resp: 15  Temp: 98.3 F (36.8 C)  SpO2: 98%

## 2020-09-30 ENCOUNTER — Observation Stay (HOSPITAL_BASED_OUTPATIENT_CLINIC_OR_DEPARTMENT_OTHER): Payer: Medicaid Other

## 2020-09-30 ENCOUNTER — Encounter (HOSPITAL_COMMUNITY): Payer: Self-pay | Admitting: Obstetrics & Gynecology

## 2020-09-30 DIAGNOSIS — U071 COVID-19: Secondary | ICD-10-CM

## 2020-09-30 DIAGNOSIS — R55 Syncope and collapse: Secondary | ICD-10-CM | POA: Diagnosis present

## 2020-09-30 DIAGNOSIS — O98512 Other viral diseases complicating pregnancy, second trimester: Secondary | ICD-10-CM | POA: Diagnosis not present

## 2020-09-30 DIAGNOSIS — Z3A24 24 weeks gestation of pregnancy: Secondary | ICD-10-CM

## 2020-09-30 DIAGNOSIS — O99352 Diseases of the nervous system complicating pregnancy, second trimester: Secondary | ICD-10-CM

## 2020-09-30 LAB — RESP PANEL BY RT-PCR (RSV, FLU A&B, COVID)  RVPGX2
Influenza A by PCR: NEGATIVE
Influenza B by PCR: NEGATIVE
Resp Syncytial Virus by PCR: NEGATIVE
SARS Coronavirus 2 by RT PCR: POSITIVE — AB

## 2020-09-30 LAB — LACTATE DEHYDROGENASE: LDH: 116 U/L (ref 98–192)

## 2020-09-30 LAB — TYPE AND SCREEN
ABO/RH(D): O POS
Antibody Screen: NEGATIVE

## 2020-09-30 LAB — ECHOCARDIOGRAM LIMITED
Area-P 1/2: 3.6 cm2
Height: 64 in
Weight: 2416 oz

## 2020-09-30 LAB — PROCALCITONIN: Procalcitonin: <0.1 ng/mL

## 2020-09-30 LAB — C-REACTIVE PROTEIN: CRP: 2.4 mg/dL — ABNORMAL HIGH (ref <1.0)

## 2020-09-30 LAB — FERRITIN: Ferritin: 28 ng/mL (ref 11–307)

## 2020-09-30 LAB — FIBRINOGEN: Fibrinogen: 453 mg/dL (ref 210–475)

## 2020-09-30 LAB — D-DIMER, QUANTITATIVE: D-Dimer, Quant: 1.06 ug/mL-FEU — ABNORMAL HIGH (ref 0.00–0.50)

## 2020-09-30 MED ORDER — CALCIUM CARBONATE ANTACID 500 MG PO CHEW: 2.0000 | CHEWABLE_TABLET | ORAL | Status: DC | PRN

## 2020-09-30 MED ORDER — PRENATAL MULTIVITAMIN CH
1.0000 | ORAL_TABLET | Freq: Every day | ORAL | Status: DC
  Administered 2020-09-30: 1 via ORAL
  Filled 2020-09-30: qty 1

## 2020-09-30 MED ORDER — ACETAMINOPHEN 325 MG PO TABS: 650.0000 mg | ORAL_TABLET | ORAL | Status: DC | PRN

## 2020-09-30 NOTE — Plan of Care (Signed)
  Problem: Education: Goal: Knowledge of disease or condition will improve 09/30/2020 1857 by Saul Fordyce, RN Outcome: Adequate for Discharge 09/30/2020 [phone] by Saul Fordyce, RN Outcome: Progressing Goal: Knowledge of the prescribed therapeutic regimen will improve Outcome: Adequate for Discharge   Problem: Clinical Measurements: Goal: Complications related to the disease process, condition or treatment will be avoided or minimized Outcome: Adequate for Discharge   Problem: Education: Goal: Knowledge of risk factors and measures for prevention of condition will improve Outcome: Adequate for Discharge   Problem: Coping: Goal: Psychosocial and spiritual needs will be supported Outcome: Adequate for Discharge   Problem: Respiratory: Goal: Complications related to the disease process, condition or treatment will be avoided or minimized Outcome: Adequate for Discharge   Problem: Health Behavior/Discharge Planning: Goal: Ability to manage health-related needs will improve Outcome: Adequate for Discharge   Problem: Clinical Measurements: Goal: Ability to maintain clinical measurements within normal limits will improve Outcome: Adequate for Discharge Goal: Will remain free from infection Outcome: Adequate for Discharge Goal: Diagnostic test results will improve Outcome: Adequate for Discharge Goal: Respiratory complications will improve Outcome: Adequate for Discharge Goal: Cardiovascular complication will be avoided Outcome: Adequate for Discharge   Problem: Activity: Goal: Risk for activity intolerance will decrease Outcome: Adequate for Discharge   Problem: Nutrition: Goal: Adequate nutrition will be maintained Outcome: Adequate for Discharge   Problem: Coping: Goal: Level of anxiety will decrease Outcome: Adequate for Discharge   Problem: Elimination: Goal: Will not experience complications related to bowel motility Outcome: Adequate for Discharge Goal: Will  not experience complications related to urinary retention Outcome: Adequate for Discharge   Problem: Pain Managment: Goal: General experience of comfort will improve Outcome: Adequate for Discharge   Problem: Safety: Goal: Ability to remain free from injury will improve Outcome: Adequate for Discharge   Problem: Skin Integrity: Goal: Risk for impaired skin integrity will decrease Outcome: Adequate for Discharge

## 2020-09-30 NOTE — Discharge Instructions (Signed)
10 Things You Can Do to Manage Your COVID-19 Symptoms at Home If you have possible or confirmed COVID-19: 1. Stay home from work and school. And stay away from other public places. If you must go out, avoid using any kind of public transportation, ridesharing, or taxis. 2. Monitor your symptoms carefully. If your symptoms get worse, call your healthcare provider immediately. 3. Get rest and stay hydrated. 4. If you have a medical appointment, call the healthcare provider ahead of time and tell them that you have or may have COVID-19. 5. For medical emergencies, call 911 and notify the dispatch personnel that you have or may have COVID-19. 6. Cover your cough and sneezes with a tissue or use the inside of your elbow. 7. Wash your hands often with soap and water for at least 20 seconds or clean your hands with an alcohol-based hand sanitizer that contains at least 60% alcohol. 8. As much as possible, stay in a specific room and away from other people in your home. Also, you should use a separate bathroom, if available. If you need to be around other people in or outside of the home, wear a mask. 9. Avoid sharing personal items with other people in your household, like dishes, towels, and bedding. 10. Clean all surfaces that are touched often, like counters, tabletops, and doorknobs. Use household cleaning sprays or wipes according to the label instructions. cdc.gov/coronavirus 03/30/2019 This information is not intended to replace advice given to you by your health care provider. Make sure you discuss any questions you have with your health care provider. Document Revised: 09/01/2019 Document Reviewed: 09/01/2019 Elsevier Patient Education  2020 Elsevier Inc.  COVID-19: How to Protect Yourself and Others Know how it spreads  There is currently no vaccine to prevent coronavirus disease 2019 (COVID-19).  The best way to prevent illness is to avoid being exposed to this virus.  The virus is  thought to spread mainly from person-to-person. ? Between people who are in close contact with one another (within about 6 feet). ? Through respiratory droplets produced when an infected person coughs, sneezes or talks. ? These droplets can land in the mouths or noses of people who are nearby or possibly be inhaled into the lungs. ? COVID-19 may be spread by people who are not showing symptoms. Everyone should Clean your hands often  Wash your hands often with soap and water for at least 20 seconds especially after you have been in a public place, or after blowing your nose, coughing, or sneezing.  If soap and water are not readily available, use a hand sanitizer that contains at least 60% alcohol. Cover all surfaces of your hands and rub them together until they feel dry.  Avoid touching your eyes, nose, and mouth with unwashed hands. Avoid close contact  Limit contact with others as much as possible.  Avoid close contact with people who are sick.  Put distance between yourself and other people. ? Remember that some people without symptoms may be able to spread virus. ? This is especially important for people who are at higher risk of getting very sick.www.cdc.gov/coronavirus/2019-ncov/need-extra-precautions/people-at-higher-risk.html Cover your mouth and nose with a mask when around others  You could spread COVID-19 to others even if you do not feel sick.  Everyone should wear a mask in public settings and when around people not living in their household, especially when social distancing is difficult to maintain. ? Masks should not be placed on young children under age 2, anyone who   has trouble breathing, or is unconscious, incapacitated or otherwise unable to remove the mask without assistance.  The mask is meant to protect other people in case you are infected.  Do NOT use a facemask meant for a healthcare worker.  Continue to keep about 6 feet between yourself and others. The  mask is not a substitute for social distancing. Cover coughs and sneezes  Always cover your mouth and nose with a tissue when you cough or sneeze or use the inside of your elbow.  Throw used tissues in the trash.  Immediately wash your hands with soap and water for at least 20 seconds. If soap and water are not readily available, clean your hands with a hand sanitizer that contains at least 60% alcohol. Clean and disinfect  Clean AND disinfect frequently touched surfaces daily. This includes tables, doorknobs, light switches, countertops, handles, desks, phones, keyboards, toilets, faucets, and sinks. www.cdc.gov/coronavirus/2019-ncov/prevent-getting-sick/disinfecting-your-home.html  If surfaces are dirty, clean them: Use detergent or soap and water prior to disinfection.  Then, use a household disinfectant. You can see a list of EPA-registered household disinfectants here. cdc.gov/coronavirus 06/01/2019 This information is not intended to replace advice given to you by your health care provider. Make sure you discuss any questions you have with your health care provider. Document Revised: 06/09/2019 Document Reviewed: 04/07/2019 Elsevier Patient Education  2020 Elsevier Inc.  

## 2020-09-30 NOTE — Discharge Summary (Signed)
Antenatal Physician Discharge Summary  Patient ID: Amanda Howe MRN: [id number] DOB/AGE: 1995-12-06 24 y.o.  Admit date: 09/29/2020 Discharge date: 09/30/2020  Admission Diagnoses: syncope  Discharge Diagnoses:  Principal Problem:   Syncope and collapse Active Problems:   Supervision of other normal pregnancy, antepartum   COVID-19 virus infection   Prenatal Procedures: NST  Consults: Internal Medicine  Hospital Course:  This is a 25 y.o. G2P1001 with IUP at [redacted]w[redacted]d admitted for observation for syncopal episodes, see H&P for details. She has had no further episodes and is feeling well. Denies leaking, bleeding, reports normal fetal movement. Denies contractions. Was seen by the hospitalist team who performed echo, which was normal. Positive COVID. They feel this is likely a combination of pregnancy and COVID, and she is safe for discharge home and will have her follow up as outpatient if she continues to have episodes. Patient is feeling well and agreeable to this plan. Reviewed precautions and reasons to return to [facility].  She was deemed stable for discharge to home with outpatient follow up.  Discharge Exam: Temp:  [98.1 F (36.7 C)-98.4 F (36.9 C)] 98.4 F (36.9 C) (01/02 1600) Pulse Rate:  [72-95] 81 (01/02 1600) Resp:  [15-18] 18 (01/02 1600) BP: (104-132)/(59-73) 114/73 (01/02 1600) SpO2:  [97 %-100 %] 99 % (01/02 1600) Weight:  [68.5 kg] 68.5 kg (01/01 2214) Physical Examination: CONSTITUTIONAL: Well-developed, well-nourished female in no acute distress.  HENT:  Normocephalic, atraumatic, External right and left ear normal. Oropharynx is clear and moist EYES: Conjunctivae and EOM are normal. Pupils are equal, round, and reactive to light. No scleral icterus.  NECK: Normal range of motion, supple, no masses SKIN: Skin is warm and dry. No rash noted. Not diaphoretic. No erythema. No pallor. NEUROLGIC: Alert and oriented to person, place, and time. Normal reflexes, muscle tone  coordination. No cranial nerve deficit noted. PSYCHIATRIC: Normal mood and affect. Normal behavior. Normal judgment and thought content. CARDIOVASCULAR: Normal heart rate noted RESPIRATORY: Effort normal, no problems with respiration noted MUSCULOSKELETAL: Normal range of motion. No edema and no tenderness. 2+ distal pulses. ABDOMEN: Soft, nontender, nondistended, gravid. CERVIX:   deferred  Fetal monitoring: FHR: 150 bpm, Variability: moderate, Accelerations: Present, Decelerations: Absent  Uterine activity: no contractions per hour  Significant Diagnostic Studies:  Results for orders placed or performed during the hospital encounter of 09/29/20 (from the past 168 hour(s))  Urinalysis, Routine w reflex microscopic Urine, Clean Catch   Collection Time: 09/29/20 10:54 PM  Result Value Ref Range   Color, Urine STRAW (A) YELLOW   APPearance HAZY (A) CLEAR   Specific Gravity, Urine 1.008 1.005 - 1.030   pH 7.0 5.0 - 8.0   Glucose, UA NEGATIVE NEGATIVE mg/dL   Hgb urine dipstick NEGATIVE NEGATIVE   Bilirubin Urine NEGATIVE NEGATIVE   Ketones, ur NEGATIVE NEGATIVE mg/dL   Protein, ur NEGATIVE NEGATIVE mg/dL   Nitrite NEGATIVE NEGATIVE   Leukocytes,Ua NEGATIVE NEGATIVE  CBC   Collection Time: 09/29/20 11:13 PM  Result Value Ref Range   WBC 9.9 4.0 - 10.5 K/uL   RBC 3.83 (L) 3.87 - 5.11 MIL/uL   Hemoglobin 11.8 (L) 12.0 - 15.0 g/dL   HCT 76.5 (L) 46.5 - 03.5 %   MCV 87.2 80.0 - 100.0 fL   MCH 30.8 26.0 - 34.0 pg   MCHC 35.3 30.0 - 36.0 g/dL   RDW 46.5 68.1 - 27.5 %   Platelets 231 150 - 400 K/uL   nRBC 0.0 0.0 - 0.2 %  Comprehensive metabolic panel   Collection Time: 09/29/20 11:13 PM  Result Value Ref Range   Sodium 137 135 - 145 mmol/L   Potassium 4.3 3.5 - 5.1 mmol/L   Chloride 104 98 - 111 mmol/L   CO2 24 22 - 32 mmol/L   Glucose, Bld 87 70 - 99 mg/dL   BUN 8 6 - 20 mg/dL   Creatinine, Ser 0.85 0.44 - 1.00 mg/dL   Calcium 8.9 8.9 - 10.3 mg/dL   Total Protein 6.6 6.5 -  8.1 g/dL   Albumin 3.1 (L) 3.5 - 5.0 g/dL   AST 25 15 - 41 U/L   ALT 22 0 - 44 U/L   Alkaline Phosphatase 62 38 - 126 U/L   Total Bilirubin 0.4 0.3 - 1.2 mg/dL   GFR, Estimated >60 >60 mL/min   Anion gap 9 5 - 15  Resp panel by RT-PCR (RSV, Flu A&B, Covid) Nasopharyngeal Swab   Collection Time: 09/29/20 11:42 PM   Specimen: Nasopharyngeal Swab; Nasopharyngeal(NP) swabs in vial transport medium  Result Value Ref Range   SARS Coronavirus 2 by RT PCR POSITIVE (A) NEGATIVE   Influenza A by PCR NEGATIVE NEGATIVE   Influenza B by PCR NEGATIVE NEGATIVE   Resp Syncytial Virus by PCR NEGATIVE NEGATIVE  Type and screen [place]   Collection Time: 09/30/20  3:25 AM  Result Value Ref Range   ABO/RH(D) O POS    Antibody Screen NEG    Sample Expiration      10/03/2020,2359 Performed at [facility], [address], [place], [place] [address]   C-reactive protein   Collection Time: 09/30/20  8:08 AM  Result Value Ref Range   CRP 2.4 (H) <1.0 mg/dL  D-dimer, quantitative (not at [facility])   Collection Time: 09/30/20  8:08 AM  Result Value Ref Range   D-Dimer, Quant 1.06 (H) 0.00 - 0.50 ug/mL-FEU  Ferritin   Collection Time: 09/30/20  8:08 AM  Result Value Ref Range   Ferritin 28 11 - 307 ng/mL  Fibrinogen   Collection Time: 09/30/20  8:08 AM  Result Value Ref Range   Fibrinogen 453 210 - 475 mg/dL  Lactate dehydrogenase   Collection Time: 09/30/20  8:08 AM  Result Value Ref Range   LDH 116 98 - 192 U/L  Procalcitonin   Collection Time: 09/30/20  8:08 AM  Result Value Ref Range   Procalcitonin <0.10 ng/mL  ECHOCARDIOGRAM LIMITED   Collection Time: 09/30/20 12:09 PM  Result Value Ref Range   Weight 2,416 oz   Height 64 in   BP 104/60 mmHg   Area-P 1/2 3.60 cm2    Discharge Condition: Stable  Disposition: Discharge disposition: 01-Home or Self Care        Discharge Instructions    Notify physician for a general feeling that "something is not  right"   Complete by: As directed    Notify physician for increase or change in vaginal discharge   Complete by: As directed    Notify physician for intestinal cramps, with or without diarrhea, sometimes described as "gas pain"   Complete by: As directed    Notify physician for leaking of fluid   Complete by: As directed    Notify physician for low, dull backache, unrelieved by heat or Tylenol   Complete by: As directed    Notify physician for menstrual like cramps   Complete by: As directed    Notify physician for pelvic pressure   Complete  by: As directed    Notify physician for uterine contractions.  These may be painless and feel like the uterus is tightening or the baby is  "balling up"   Complete by: As directed    Notify physician for vaginal bleeding   Complete by: As directed    PRETERM LABOR:  Includes any of the follwing symptoms that occur between 20 - [redacted] weeks gestation.  If these symptoms are not stopped, preterm labor can result in preterm delivery, placing your baby at risk   Complete by: As directed      Allergies as of 09/30/2020   No Known Allergies     Medication List    STOP taking these medications   HYDROcodone-acetaminophen 5-325 MG tablet Commonly known as: NORCO/VICODIN     TAKE these medications   cephALEXin 500 MG capsule Commonly known as: KEFLEX Take 500 mg by mouth 3 (three) times daily.   ondansetron 4 MG disintegrating tablet Commonly known as: Zofran ODT Take 1 tablet (4 mg total) by mouth every 6 (six) hours as needed for nausea.   pantoprazole 20 MG tablet Commonly known as: Protonix Take 1 tablet (20 mg total) by mouth daily.   Prenatal Vitamin 27-0.8 MG Tabs Take 1 tablet by mouth daily.       Follow-up Information    [facility]. Go on 10/02/2020.   Specialty: Obstetrics and Gynecology Contact information: [address] [place]  [address] [phone]              Signed: Baldemar Lenis, MD, [facility] Attending [facility] (Faculty Practice)  09/30/2020, 5:43 PM

## 2020-09-30 NOTE — H&P (Signed)
Amanda Howe is a 25 y.o. G24P1001 female at [redacted]w[redacted]d presenting for 3 syncopal episodes in the past week. She has felt dizzy off and on this entire pregnancy and has a history of migraines with a negative neuro workup in her past. She has been assured that her transient dizziness upon standing is a normal variation of pregnancy. She's had no syncopal episodes prior to this week. She and her family traveled over Christmas, all had negative Covid tests prior to and after the holiday. She states that this week, they've all had "little colds" for which she tested negative for Covid. The syncopal episodes have all happened since she became symptomatic. Two have happened while she was standing, and once while she was sitting in a chair. She does not feel dizzy prior and does not know they've happened until her family is trying to wake her up. Her partner reports that she was difficult to rouse this evening for two minutes. She denies contractions, cramping, vaginal bleeding, LOF, or discharge.  OB History    Gravida  2   Para  1   Term  1   Preterm      AB      Living  1     SAB      IAB      Ectopic      Multiple      Live Births  1          Past Medical History:  Diagnosis Date  . Anemia   . Ovarian cyst    Past Surgical History:  Procedure Laterality Date  . NO PAST SURGERIES     Family History: family history includes Healthy in her mother; Hypertension in her maternal grandmother. Social History:  reports that she has quit smoking. Her smoking use included cigars. She quit after 2.00 years of use. She has never used smokeless tobacco. She reports previous alcohol use. She reports that she does not use drugs.    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  HENT: Positive for rhinorrhea. Negative for congestion and sore throat.   Respiratory: Negative for cough, chest tightness and shortness of breath.   Cardiovascular: Negative for palpitations.  Gastrointestinal: Negative for abdominal pain, constipation, diarrhea and vomiting.  Genitourinary: Negative for vaginal bleeding and vaginal discharge.  Musculoskeletal: Negative for back pain and neck pain.  Neurological: Positive for dizziness (has been dizzy off and on this pregnancy, was told it was normal), syncope (3 episodes in the last week, once while standing/walking, once while sitting in a chair, none associated with hunger)) and headaches (has had headaches her entire pregnancy and prior, has had neuro workup (normal)). Negative for seizures and light-headedness (does not feel the syncope coming).  All other systems reviewed and are negative.  Maternal Medical History:  Prenatal complications: PIH (has had elevated readings in the past few years but no diagnosis of htn).       Blood pressure 125/66, pulse 95, temperature 98.3 F (36.8 C), temperature source Oral, resp. rate 15, height 5\' 4"  (1.626 m), weight 151 lb (68.5 kg), last menstrual period 04/10/2020, SpO2 98 %.  Maternal Exam:  Abdomen: Patient reports no abdominal tenderness. Introitus: not evaluated.   Cervix: not evaluated.   Fetal Exam Fetal Monitor Review: Mode: ultrasound.   Baseline rate: 155.  Variability: moderate (6-25 bpm).   Pattern: accelerations present and no decelerations.    Fetal State Assessment: Category I - tracings are normal.     Physical Exam Vitals and nursing note reviewed.  Constitutional:      General: She is not in acute distress.    Appearance: Normal appearance. She is  normal weight. She is not ill-appearing.  HENT:     Nose: Rhinorrhea present. No congestion.     Mouth/Throat:     Mouth: Mucous membranes are moist.  Eyes:     Pupils: Pupils are equal, round, and reactive to light.  Cardiovascular:     Rate and Rhythm: Normal rate and regular rhythm.     Pulses: Normal pulses.     Heart sounds: Normal heart sounds.     Comments: EKG normal  Pulmonary:     Effort: Pulmonary effort is normal. No respiratory distress.     Breath sounds: Normal breath sounds. No wheezing or rhonchi.  Abdominal:     General: Bowel sounds are normal.     Palpations: Abdomen is soft.  Musculoskeletal:        General: Normal range of motion.     Cervical back: Normal range of motion.  Skin:    General: Skin is warm and dry.     Capillary Refill: Capillary refill takes less than 2 seconds.  Neurological:     Mental Status: She is alert and oriented to person, place, and time.  Psychiatric:        Mood and Affect: Mood normal.        Behavior: Behavior normal.        Thought Content: Thought content normal.        Judgment: Judgment normal.     Results for orders placed or performed during the hospital encounter of 09/29/20 (from the past 24 hour(s))  Urinalysis, Routine w reflex microscopic Urine, Clean Catch     Status: Abnormal   Collection Time: 09/29/20 10:54 PM  Result Value Ref Range   Color, Urine STRAW (A) YELLOW   APPearance HAZY (A) CLEAR   Specific Gravity, Urine 1.008 1.005 - 1.030   pH 7.0 5.0 - 8.0   Glucose, UA NEGATIVE NEGATIVE mg/dL   Hgb urine dipstick NEGATIVE NEGATIVE   Bilirubin Urine NEGATIVE NEGATIVE   Ketones, ur NEGATIVE NEGATIVE mg/dL   Protein, ur NEGATIVE NEGATIVE mg/dL   Nitrite NEGATIVE NEGATIVE   Leukocytes,Ua NEGATIVE NEGATIVE  CBC     Status: Abnormal   Collection Time: 09/29/20 11:13 PM  Result Value Ref Range   WBC 9.9 4.0 - 10.5 K/uL   RBC 3.83 (L) 3.87 - 5.11 MIL/uL   Hemoglobin 11.8 (L) 12.0 - 15.0 g/dL   HCT 81.1  (L) 57.2 - 46.0 %   MCV 87.2 80.0 - 100.0 fL   MCH 30.8 26.0 - 34.0 pg   MCHC 35.3 30.0 - 36.0 g/dL   RDW 62.0 35.5 - 97.4 %   Platelets 231 150 - 400 K/uL   nRBC 0.0 0.0 - 0.2 %  Comprehensive metabolic panel     Status: Abnormal   Collection Time: 09/29/20 11:13 PM  Result Value Ref Range   Sodium 137 135 - 145 mmol/L   Potassium 4.3 3.5 - 5.1 mmol/L   Chloride 104 98 -  111 mmol/L   CO2 24 22 - 32 mmol/L   Glucose, Bld 87 70 - 99 mg/dL   BUN 8 6 - 20 mg/dL   Creatinine, Ser 0.85 0.44 - 1.00 mg/dL   Calcium 8.9 8.9 - 10.3 mg/dL   Total Protein 6.6 6.5 - 8.1 g/dL   Albumin 3.1 (L) 3.5 - 5.0 g/dL   AST 25 15 - 41 U/L   ALT 22 0 - 44 U/L   Alkaline Phosphatase 62 38 - 126 U/L   Total Bilirubin 0.4 0.3 - 1.2 mg/dL   GFR, Estimated >60 >60 mL/min   Anion gap 9 5 - 15  Resp panel by RT-PCR (RSV, Flu A&B, Covid) Nasopharyngeal Swab     Status: Abnormal   Collection Time: 09/29/20 11:42 PM   Specimen: Nasopharyngeal Swab; Nasopharyngeal(NP) swabs in vial transport medium  Result Value Ref Range   SARS Coronavirus 2 by RT PCR POSITIVE (A) NEGATIVE   Influenza A by PCR NEGATIVE NEGATIVE   Influenza B by PCR NEGATIVE NEGATIVE   Resp Syncytial Virus by PCR NEGATIVE NEGATIVE   Prenatal labs: ABO, Rh: O/Positive/-- (09/14 1039) Antibody: Negative (09/14 1039) Rubella: 5.99 (09/14 1039) RPR: Non Reactive (09/14 1039)  HBsAg: Negative (09/14 1039)  HIV: Non Reactive (09/14 1039)  GBS:     Assessment/Plan: CBC & CMP normal Consulted hospitalist (Dr. Myna Hidalgo) regarding syncope and covid+ status, he recommended admission to antenatal for 24hrs of telemetry, consult with hospitalist team so they can decide if she needs an echocardiogram. Reported this to Dr. Roselie Awkward.  Admit to antenatal for telemetry/observation Hospitalist team notified, will see patient in the morning per Dr. Florene Route.  Gabriel Carina 09/30/2020, 2:59 AM

## 2020-09-30 NOTE — Plan of Care (Signed)
Room and routine orientation done. Plan of care discussed,patient voiced understanding.

## 2020-09-30 NOTE — Progress Notes (Signed)
  Echocardiogram 2D Echocardiogram has been performed.  Delcie Roch 09/30/2020, 12:09 PM

## 2020-09-30 NOTE — Plan of Care (Signed)
  Problem: Education: Goal: Knowledge of disease or condition will improve Outcome: Progressing   

## 2020-09-30 NOTE — Consult Note (Signed)
Medical Consultation   Amanda Howe  NIO:[id number]  DOB: [birth year]  DOA: 09/29/2020  PCP: Oneita Hurt No   Requesting physician: Debroah Loop - OB/GYN  Reason for consultation: Consulted hospitalist (Dr. Antionette Char) regarding syncope and covid+ status, he recommended admission to antenatal for 24hrs of telemetry, consult with hospitalist team so they can decide if she needs an echocardiogram. Reported this to Dr. Debroah Loop.  Admit to antenatal for telemetry/observation  Hospitalist team notified, will see patient in the morning per Dr. Yolanda Bonine.   History of Present Illness: Amanda Howe is an 25 y.o. female without significant PMH at 24+[redacted] weeks gestation presenting with recurrent syncope.  She reports living at home with her husband and 6yo son, neither of whom has COVID.  She had an episode yesterday with LOC while walking down the hall.  She does not remember precursor symptoms and the next thing she knew she was in the floor; her fainting episode was witnessed by her son.  She reports that she had 2 similar episodes last week which were also witnessed by her son.  No apparent shaking, tongue biting, or incontinence.  She has had a couple of days of mild fever and fatigue as well as periodic dizziness and headaches during the pregnancy.     Review of Systems:  ROS As per HPI otherwise 10 point review of systems negative.    Past Medical History: Past Medical History:  Diagnosis Date  . Anemia   . Ovarian cyst     Past Surgical History: Past Surgical History:  Procedure Laterality Date  . NO PAST SURGERIES       Allergies:  No Known Allergies   Social History:  reports that she has quit smoking. Her smoking use included cigars. She quit after 2.00 years of use. She has never used smokeless tobacco. She reports previous alcohol use. She reports that she does not use drugs.   Family History: Family History  Problem Relation Age of Onset  . Healthy Mother   .  Hypertension Maternal Grandmother       Physical Exam: Vitals:   09/30/20 0259 09/30/20 0739 09/30/20 1152 09/30/20 1600  BP: 110/68 104/60 104/67 114/73  Pulse: 72 81 77 81  Resp: 17 17 18 18   Temp: 98.1 F (36.7 C) 98.4 F (36.9 C) 98.4 F (36.9 C) 98.4 F (36.9 C)  TempSrc:  Oral Oral Oral  SpO2: 98% 99% 100% 99%  Weight:      Height:        Constitutional: Alert and awake, oriented x3, not in any acute distress. Eyes: PERLA, EOMI, irises appear normal, anicteric sclera,  ENMT: external ears and nose appear normal, normal hearing, Lips appear normal, oropharynx mucosa, tongue appear normal  Neck: neck appears normal, no masses, normal ROM CVS: S1-S2 clear, no murmur rubs or gallops, no LE edema, normal pedal pulses  Respiratory:  clear to auscultation bilaterally, no wheezing, rales or rhonchi. Respiratory effort normal. No accessory muscle use.  Abdomen: gravid, nontender Musculoskeletal: : no cyanosis, clubbing or edema noted bilaterally Neuro: Cranial nerves II-XII intact Psych: judgement and insight appear normal, stable mood and affect, mental status Skin: no rashes or lesions or ulcers, no induration or nodules    Data reviewed:  I have personally reviewed the recent labs and imaging studies  Pertinent Labs:   Albumin 3.1 WBC 9.9 Hgb 11.8 UA WNL COVID POSITIVE   Inpatient Medications:  Scheduled Meds: . prenatal multivitamin  1 tablet Oral Q1200   Continuous Infusions:   Radiological Exams on Admission: ECHOCARDIOGRAM LIMITED  Result Date: 09/30/2020    ECHOCARDIOGRAM LIMITED REPORT   Patient Name:   Amanda Howe Date of Exam: 09/30/2020 Medical Rec #:  [id number]      Height:       64.0 in Accession #:    [id number]     Weight:       151.0 lb Date of Birth:  [birth year]      BSA:          1.736 m Patient Age:    24 years       BP:           104/67 mmHg Patient Gender: F              HR:           71 bpm. Exam Location:  Inpatient Procedure: Limited  Echo, Limited Color Doppler and Cardiac Doppler Indications:    syncope  History:        Patient has no prior history of Echocardiogram examinations.                 Covid; Signs/Symptoms:pregnancy.  Sonographer:    Delcie Roch Referring Phys: [id number] Karissa Meenan IMPRESSIONS  1. Left ventricular ejection fraction, by estimation, is 55 to 60%. The left ventricle has normal function. The left ventricle has no regional wall motion abnormalities. Left ventricular diastolic parameters were normal.  2. Right ventricular systolic function is normal. The right ventricular size is normal. Tricuspid regurgitation signal is inadequate for assessing PA pressure.  3. The mitral valve is grossly normal. No evidence of mitral valve regurgitation.  4. The aortic valve is tricuspid. Aortic valve regurgitation is not visualized.  5. The inferior vena cava is normal in size with greater than 50% respiratory variability, suggesting right atrial pressure of 3 mmHg. FINDINGS  Left Ventricle: Left ventricular ejection fraction, by estimation, is 55 to 60%. The left ventricle has normal function. The left ventricle has no regional wall motion abnormalities. The left ventricular internal cavity size was normal in size. There is  no left ventricular hypertrophy. Left ventricular diastolic parameters were normal. Right Ventricle: The right ventricular size is normal. No increase in right ventricular wall thickness. Right ventricular systolic function is normal. Tricuspid regurgitation signal is inadequate for assessing PA pressure. Left Atrium: Left atrial size was normal in size. Right Atrium: Right atrial size was normal in size. Pericardium: There is no evidence of pericardial effusion. Mitral Valve: The mitral valve is grossly normal. Tricuspid Valve: The tricuspid valve is grossly normal. Tricuspid valve regurgitation is trivial. Aortic Valve: The aortic valve is tricuspid. Aortic valve regurgitation is not visualized. Pulmonic  Valve: The pulmonic valve was grossly normal. Pulmonic valve regurgitation is trivial. Aorta: The aortic root is normal in size and structure. Venous: The inferior vena cava is normal in size with greater than 50% respiratory variability, suggesting right atrial pressure of 3 mmHg. IAS/Shunts: No atrial level shunt detected by color flow Doppler.  Diastology LV e' medial:    12.10 cm/s LV E/e' medial:  8.2 LV e' lateral:   17.50 cm/s LV E/e' lateral: 5.7  IVC IVC diam: 1.40 cm AORTIC VALVE LVOT Vmax:   101.00 cm/s LVOT Vmean:  69.700 cm/s LVOT VTI:    0.204 m  AORTA Ao Asc diam: 2.60 cm MITRAL VALVE MV Area (PHT): 3.60 cm    SHUNTS  MV Decel Time: 211 msec    Systemic VTI: 0.20 m MV E velocity: 99.40 cm/s MV A velocity: 47.60 cm/s MV E/A ratio:  2.09 Rozann Lesches MD Electronically signed by Rozann Lesches MD Signature Date/Time: 09/30/2020/1:05:15 PM    Final     Impression/Recommendations Principal Problem:   Syncope and collapse Active Problems:   Supervision of other normal pregnancy, antepartum   COVID-19 virus infection  Syncope -Patient with 3 reported episodes of syncope, all witnessed by her 6yo son and she is not aware of what happened with any -Yesterday's episode is likely related to pregnancy plus COVID-19 infection -She appeared well at the time of my evaluation and without symptoms -She does not appear to have high-risk syncope -History and exam provide the most value in evaluation of syncope; additional testing is generally low yield and high cost. -Echo was performed and was reassuring -Consider event monitor and then loop recorder if syncope continues to be recurrent; this is the gold standard in recurrent, unexplained syncope.  COVID-19 infection -Patient appears to be generally asymptomatic or only mildly symptomatic  -She is vaccinated (April) but has not received her booster -She does not have a current O2 requirement  -COVID POSITIVE -Given her minimal symptoms and  vital sign stability she is likely ok for home quarantine without treatment at this time -Patient was seen wearing full PPE including: gown, gloves, head cover, N95, and face shield; donning and doffing was in compliance with current standards.  IUP -G2P1 at 24+[redacted] weeks gestation -Followed by [facility]    Thank you for this consultation.  Our [facility] hospitalist team will follow the patient with you if she remains hospitalized.  However, she does appear to be safe for discharge and outpatient f/u at this time.    Karmen Bongo M.D. [facility] 09/30/2020, 5:06 PM

## 2020-10-02 ENCOUNTER — Encounter: Payer: Self-pay | Admitting: Nurse Practitioner

## 2020-10-02 ENCOUNTER — Telehealth (INDEPENDENT_AMBULATORY_CARE_PROVIDER_SITE_OTHER): Payer: Medicaid Other | Admitting: Nurse Practitioner

## 2020-10-02 VITALS — BP 114/73 | Wt 151.0 lb

## 2020-10-02 DIAGNOSIS — O99891 Other specified diseases and conditions complicating pregnancy: Secondary | ICD-10-CM

## 2020-10-02 DIAGNOSIS — Z3A25 25 weeks gestation of pregnancy: Secondary | ICD-10-CM

## 2020-10-02 DIAGNOSIS — M5432 Sciatica, left side: Secondary | ICD-10-CM | POA: Insufficient documentation

## 2020-10-02 DIAGNOSIS — Z3480 Encounter for supervision of other normal pregnancy, unspecified trimester: Secondary | ICD-10-CM

## 2020-10-02 DIAGNOSIS — Z148 Genetic carrier of other disease: Secondary | ICD-10-CM | POA: Insufficient documentation

## 2020-10-02 DIAGNOSIS — U071 COVID-19: Secondary | ICD-10-CM

## 2020-10-02 DIAGNOSIS — O98512 Other viral diseases complicating pregnancy, second trimester: Secondary | ICD-10-CM

## 2020-10-02 NOTE — Progress Notes (Addendum)
  TELEHEALTH OBSTETRICS VISIT ENCOUNTER NOTE  Epic system is down this morning and needed to do phone visit rather than MyChart visit  Provider location: Center for [facility] at [facility]   I connected with Amanda Howe on 10/02/20 at  8:30 AM EST by telephone at home and verified that I am speaking with the correct person using two identifiers.   I discussed the limitations, risks, security and privacy concerns of performing an evaluation and management service by telephone and the availability of in person appointments. I also discussed with the patient that there may be a patient responsible charge related to this service. The patient expressed understanding and agreed to proceed.  Subjective:  Amanda Howe is a 25 y.o. G2P1001 at [redacted]w[redacted]d being followed for ongoing prenatal care.  She is currently monitored for the following issues for this low-risk pregnancy and has Supervision of other normal pregnancy, antepartum; Sickle cell trait (HCC); Abnormal fetal ultrasound; Kidney stone complicating pregnancy; Syncope and collapse; and COVID-19 virus infection on their problem list.  Patient reports no further syncope, some cough with Covid, and having sciatica. Reports fetal movement. Denies any contractions, bleeding or leaking of fluid.   The following portions of the patient's history were reviewed and updated as appropriate: allergies, current medications, past family history, past medical history, past social history, past surgical history and problem list.   Objective:   General:  Alert, oriented and cooperative.   Mental Status: Normal mood and affect perceived. Normal judgment and thought content.  Rest of physical exam deferred due to type of encounter BP 114/73  Assessment and Plan:  Pregnancy: G2P1001 at [redacted]w[redacted]d 1. Supervision of other normal pregnancy, antepartum Next appointment will be fasting appointment for glucola  2. COVID-19 affecting pregnancy in  second trimester Hospitalized for one day due to syncope associated with positive Covid diagnosis Doing well at home except for cough Advised to get Flonase today and use nasally for congestion   3.  sciatica of left side Advised stretcching exercises to see if stretching low back improves sciatica. If no improvement in 2 weeks, call the office and we can refer to PT at [facility].  Advised this will improve once the baby is born but if PT is needed in the pregnancy, it is available.  Preterm labor symptoms and general obstetric precautions including but not limited to vaginal bleeding, contractions, leaking of fluid and fetal movement were reviewed in detail with the patient.  I discussed the assessment and treatment plan with the patient. The patient was provided an opportunity to ask questions and all were answered. The patient agreed with the plan and demonstrated an understanding of the instructions. The patient was advised to call back or seek an in-person office evaluation/go to MAU at [facility] for any urgent or concerning symptoms. Please refer to After Visit Summary for other counseling recommendations.   I provided 6 minutes of non-face-to-face time during this encounter.    Future Appointments  Date Time Provider Department Center  10/23/2020  9:10 AM Aviva Signs, CNM [facility] None    Currie Paris, NP Center for [employer], [facility]

## 2020-10-23 ENCOUNTER — Encounter: Payer: Self-pay | Admitting: Advanced Practice Midwife

## 2020-10-23 ENCOUNTER — Other Ambulatory Visit: Payer: Self-pay

## 2020-10-23 ENCOUNTER — Ambulatory Visit (INDEPENDENT_AMBULATORY_CARE_PROVIDER_SITE_OTHER): Payer: Medicaid Other | Admitting: Advanced Practice Midwife

## 2020-10-23 VITALS — BP 110/84 | HR 77 | Wt 167.0 lb

## 2020-10-23 DIAGNOSIS — Z23 Encounter for immunization: Secondary | ICD-10-CM | POA: Diagnosis not present

## 2020-10-23 DIAGNOSIS — R102 Pelvic and perineal pain: Secondary | ICD-10-CM | POA: Diagnosis not present

## 2020-10-23 DIAGNOSIS — Z3A28 28 weeks gestation of pregnancy: Secondary | ICD-10-CM

## 2020-10-23 DIAGNOSIS — U071 COVID-19: Secondary | ICD-10-CM | POA: Diagnosis not present

## 2020-10-23 DIAGNOSIS — O26893 Other specified pregnancy related conditions, third trimester: Secondary | ICD-10-CM

## 2020-10-23 DIAGNOSIS — Z3480 Encounter for supervision of other normal pregnancy, unspecified trimester: Secondary | ICD-10-CM

## 2020-10-23 NOTE — Patient Instructions (Signed)

## 2020-10-23 NOTE — Progress Notes (Signed)
Patient sent to lab for 28 week labwork. Janaye Corp RN 

## 2020-10-23 NOTE — Progress Notes (Signed)
   PRENATAL VISIT NOTE  Subjective:  Amanda Howe is a 25 y.o. G2P1001 at [redacted]w[redacted]d being seen today for ongoing prenatal care.  She is currently monitored for the following issues for this low-risk pregnancy and has Supervision of other normal pregnancy, antepartum; Sickle cell trait (HCC); Abnormal fetal ultrasound; Kidney stone complicating pregnancy; Syncope and collapse; COVID-19 virus infection; Sciatica of left side; and Genetic carrier status on their problem list.  Patient reports intermittent pain in groin and pelvic bones/joints, better iwth pregnancy support belt.  Contractions: Not present. Vag. Bleeding: None.  Movement: Present. Denies leaking of fluid.   The following portions of the patient's history were reviewed and updated as appropriate: allergies, current medications, past family history, past medical history, past social history, past surgical history and problem list.   Objective:   Vitals:   10/23/20 0832  BP: 110/84  Pulse: 77  Weight: 167 lb (75.8 kg)    Fetal Status: Fetal Heart Rate (bpm): 145 Fundal Height: 29 cm Movement: Present     General:  Alert, oriented and cooperative. Patient is in no acute distress.  Skin: Skin is warm and dry. No rash noted.   Cardiovascular: Normal heart rate noted  Respiratory: Normal respiratory effort, no problems with respiration noted  Abdomen: Soft, gravid, appropriate for gestational age.  Pain/Pressure: Present     Pelvic: Cervical exam deferred        Extremities: Normal range of motion.  Edema: None  Mental Status: Normal mood and affect. Normal behavior. Normal judgment and thought content.   Assessment and Plan:  Pregnancy: G2P1001 at [redacted]w[redacted]d 1. [redacted] weeks gestation of pregnancy  - Glucose Tolerance, 2 Hours w/1 Hour - RPR - HIV antibody (with reflex) - CBC  2. Supervision of other normal pregnancy, antepartum  - Glucose Tolerance, 2 Hours w/1 Hour - RPR - HIV antibody (with reflex) - CBC  3. COVID-19 virus  infection     Had vaccine in April, has not had booster, recommended     Had Covid in January, recovered well  4. Pelvic pain affecting pregnancy in third trimester, antepartum     Offered PT     States has only worn Prg Support belt for 2 days, and it did help, so wants to try that first  Preterm labor symptoms and general obstetric precautions including but not limited to vaginal bleeding, contractions, leaking of fluid and fetal movement were reviewed in detail with the patient. Please refer to After Visit Summary for other counseling recommendations.   Return in about 2 weeks (around 11/06/2020) for [facility].  Future Appointments  Date Time Provider Department Center  10/23/2020  9:10 AM Aviva Signs, CNM [facility] None  11/06/2020 10:55 AM Mayford Knife Elby Showers, CNM [facility] None    Wynelle Bourgeois, CNM

## 2020-10-24 ENCOUNTER — Encounter: Payer: Self-pay | Admitting: Advanced Practice Midwife

## 2020-10-24 DIAGNOSIS — O24419 Gestational diabetes mellitus in pregnancy, unspecified control: Secondary | ICD-10-CM | POA: Insufficient documentation

## 2020-10-24 LAB — CBC
Hematocrit: 34.8 % (ref 34.0–46.6)
Hemoglobin: 11.6 g/dL (ref 11.1–15.9)
MCH: 29.6 pg (ref 26.6–33.0)
MCHC: 33.3 g/dL (ref 31.5–35.7)
MCV: 89 fL (ref 79–97)
Platelets: 267 10*3/uL (ref 150–450)
RBC: 3.92 x10E6/uL (ref 3.77–5.28)
RDW: 12.7 % (ref 11.7–15.4)
WBC: 13.4 10*3/uL — ABNORMAL HIGH (ref 3.4–10.8)

## 2020-10-24 LAB — GLUCOSE TOLERANCE, 2 HOURS W/ 1HR
Glucose, 1 hour: 183 mg/dL — ABNORMAL HIGH (ref 65–179)
Glucose, 2 hour: 99 mg/dL (ref 65–152)
Glucose, Fasting: 85 mg/dL (ref 65–91)

## 2020-10-24 LAB — HIV ANTIBODY (ROUTINE TESTING W REFLEX): HIV Screen 4th Generation wRfx: NONREACTIVE

## 2020-10-24 LAB — RPR: RPR Ser Ql: NONREACTIVE

## 2020-10-25 ENCOUNTER — Telehealth: Payer: Self-pay

## 2020-10-25 DIAGNOSIS — R7309 Other abnormal glucose: Secondary | ICD-10-CM

## 2020-10-25 NOTE — Telephone Encounter (Signed)
Attempted to reach patient by phone- receive mailbox is full message.   Have placed diabetes education orders in.   Sent My chart message as well.Armandina Stammer RN

## 2020-10-25 NOTE — Telephone Encounter (Signed)
-----   Message from Aviva Signs, CNM sent at 10/24/2020  8:15 PM EST ----- Regarding: Abnormal glucola GTT abnormal, barely!   Can you schedule her for class and notify her?  Thanks Hilda Lias

## 2020-11-01 ENCOUNTER — Other Ambulatory Visit: Payer: Medicaid Other

## 2020-11-06 ENCOUNTER — Encounter: Payer: Self-pay | Admitting: Advanced Practice Midwife

## 2020-11-06 ENCOUNTER — Telehealth (INDEPENDENT_AMBULATORY_CARE_PROVIDER_SITE_OTHER): Payer: Medicaid Other | Admitting: Advanced Practice Midwife

## 2020-11-06 ENCOUNTER — Other Ambulatory Visit: Payer: Self-pay

## 2020-11-06 DIAGNOSIS — O24419 Gestational diabetes mellitus in pregnancy, unspecified control: Secondary | ICD-10-CM | POA: Diagnosis not present

## 2020-11-06 DIAGNOSIS — Z3480 Encounter for supervision of other normal pregnancy, unspecified trimester: Secondary | ICD-10-CM

## 2020-11-06 DIAGNOSIS — Z3A30 30 weeks gestation of pregnancy: Secondary | ICD-10-CM

## 2020-11-06 NOTE — Progress Notes (Signed)
I connected with Amanda Howe 11/06/20 at 10:55 AM EST by: MyChart video and verified that I am speaking with the correct person using two identifiers.  Patient is located at her new home in [place] and provider is located at [employer].     The purpose of this virtual visit is to provide medical care while limiting exposure to the novel coronavirus. I discussed the limitations, risks, security and privacy concerns of performing an evaluation and management service by MyChart video and the availability of in person appointments. I also discussed with the patient that there may be a patient responsible charge related to this service. By engaging in this virtual visit, you consent to the provision of healthcare.  Additionally, you authorize for your insurance to be billed for the services provided during this visit.  The patient expressed understanding and agreed to proceed.  The following staff members participated in the virtual visit:  Armandina Stammer RN    PRENATAL VISIT NOTE  Subjective:  Amanda Howe is a 25 y.o. G2P1001 at [redacted]w[redacted]d  for phone visit for ongoing prenatal care.  She is currently monitored for the following issues for this high-risk pregnancy and has Supervision of other normal pregnancy, antepartum; Sickle cell trait (HCC); Abnormal fetal ultrasound; Kidney stone complicating pregnancy; Syncope and collapse; COVID-19 virus infection; Sciatica of left side; Genetic carrier status; and Gestational diabetes on their problem list.  Patient reports no complaints.  Contractions: Not present. Vag. Bleeding: None.  Movement: Present. Denies leaking of fluid.   The following portions of the patient's history were reviewed and updated as appropriate: allergies, current medications, past family history, past medical history, past social history, past surgical history and problem list.   Objective:   Vitals:   11/06/20 1035  BP: 118/80   Self-Obtained  Fetal Status:      Movement: Present     Assessment and Plan:  Pregnancy: G2P1001 at [redacted]w[redacted]d 1. Gestational diabetes mellitus (GDM), antepartum, gestational diabetes method of control unspecified Notified of GDM diagnosis RN tried to notify her last week but phone mailbox was full Discussed treatment plan and diabetic diet Results given to pt to give to her new doctor  2. Supervision of other normal pregnancy, antepartum Had visit with her new doctor yesterday. He delivered all of her mother's children He has arranged PT visit for her sciatica  Preterm labor symptoms and general obstetric precautions including but not limited to vaginal bleeding, contractions, leaking of fluid and fetal movement were reviewed in detail with the patient.  No follow-ups on file.  Patient will continue care in [facility] and call us if needed  Time spent on virtual visit: 9 minutes  Wynelle Bourgeois, CNM

## 2020-11-06 NOTE — Progress Notes (Signed)
Patient moved to [place]. This virtual visit is for ensuring she had care there. Patient states she had first visit in [place] yesterday. Armandina Stammer RN

## 2020-11-06 NOTE — Patient Instructions (Signed)
Gestational Diabetes Mellitus, Diagnosis Gestational diabetes mellitus is a form of diabetes. It can happen when you are pregnant. The diabetes goes away after you give birth. If you do not get treated for this condition, it may cause problems for you or your baby. What are the causes? This condition is caused by changes in your body when you are pregnant. When these happen:  A part of the body called the pancreas does not make enough insulin.  The body cannot use insulin in the right way. Sugars cannot get into cells in your body. The sugars stay in your blood. This leads to high blood sugar.   What increases the risk?  Being older than age 25 when pregnant.  Having someone with diabetes in your family.  Too much body weight.  Having had this condition in the past.  Polycystic ovary syndrome.  Being pregnant with more than one baby. What are the signs or symptoms?  Being thirsty often.  Being hungry often.  Needing to pee more often. How is this treated?  Eat a healthy diet.  Get more exercise.  Check your blood sugar often.  Take insulin and other medicines, if needed.  Work with an expert on this condition, if told. Follow these instructions at home: Learn about your diabetes Ask your doctor:  How often should I check my blood sugar? Where do I get the equipment?  What medicines do I need? When should I take them?  Do I need to meet with an educator?  Who can I call if I have questions?  Where can I find a support group? General instructions  Take medicines only as told by your doctor.  Stay at a healthy weight.  Drink enough fluid to keep your pee pale yellow.  Wear an alert bracelet or carry a card that shows you have this condition.  Keep all follow-up visits. Where to find more information  American Diabetes Association (ADA): diabetes.org  Association of Diabetes Care & Education Specialists (ADCES): diabeteseducator.org  Centers for  Disease Control and Prevention (CDC): cdc.gov  American Pregnancy Association: americanpregnancy.org  U.S. Department of Agriculture MyPlate: myplate.gov Contact a doctor if:  Your blood sugar is at or above 240 mg/dL (13.3 mmol/L).  Your blood sugar is at or above 200 mg/dL (11.1 mmol/L) and you have ketones in your pee.  You have a fever.  You are sick for 2 days or more and you do not get better.  You have either of these problems for more than 6 hours: ? You vomit every time you eat or drink. ? You have watery poop (diarrhea). Get help right away if:  You cannot think clearly.  You are not breathing well.  You have a lot of ketones in your pee.  Your baby seems to move less than normal.  Abnormal fluid or blood starts to come out of your vagina.  You start having contractions before your due date. You may feel your belly tighten.  You have a very bad headache. These symptoms may be an emergency. Get help right away. Call your local emergency services (911 in the [place]  Do not wait to see if the symptoms will go away.  Do not drive yourself to the hospital. Summary  Gestational diabetes is a form of diabetes. It can happen when you are pregnant.  This condition occurs when your body cannot make or use insulin in the right way.  Eat a healthy diet, exercise, and use medicines or insulin   as told by your doctor.  Tell your doctor if your blood sugar is high, you have a fever, or you vomit every time you eat or drink.  Get help right away if you cannot think clearly, you are not breathing well, or your baby seems to move less than normal. This information is not intended to replace advice given to you by your health care provider. Make sure you discuss any questions you have with your health care provider. Document Revised: 02/20/2020 Document Reviewed: 02/20/2020 Elsevier Patient Education  2021 Elsevier Inc.  

## 2022-02-13 IMAGING — US US OB TRANSVAGINAL
1 series · 15 of 28 positions shown · non-contrast
Comparison: 05/24/2020

CLINICAL DATA: Fetal bradycardia

EXAM:
OBSTETRIC <14 WK ULTRASOUND
TECHNIQUE: Transabdominal ultrasound was performed for evaluation of the
gestation as well as the maternal uterus and adnexal regions.

[Series 1: us ob transvaginal · 15 of 68 slices shown]
[im 1/68]
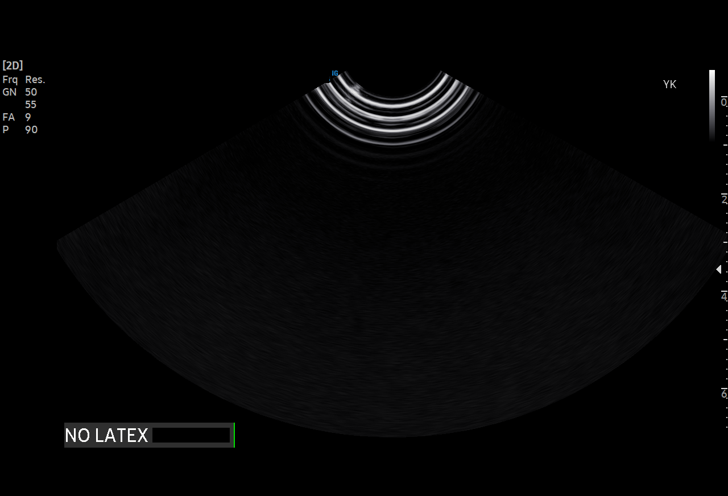
[im 5/68]
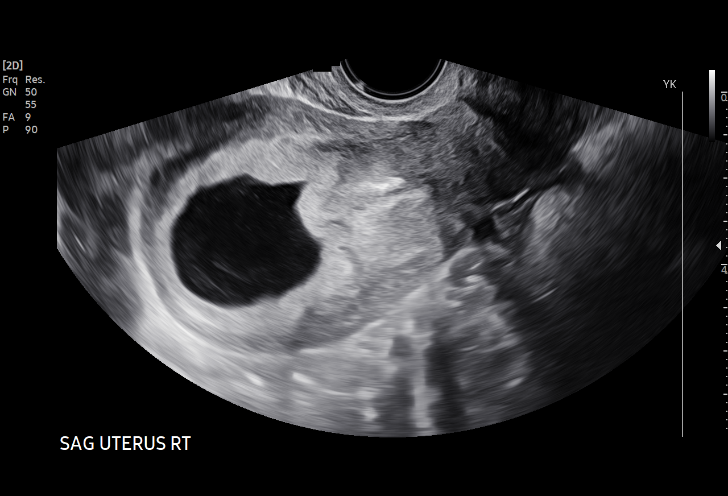
[im 10/68]
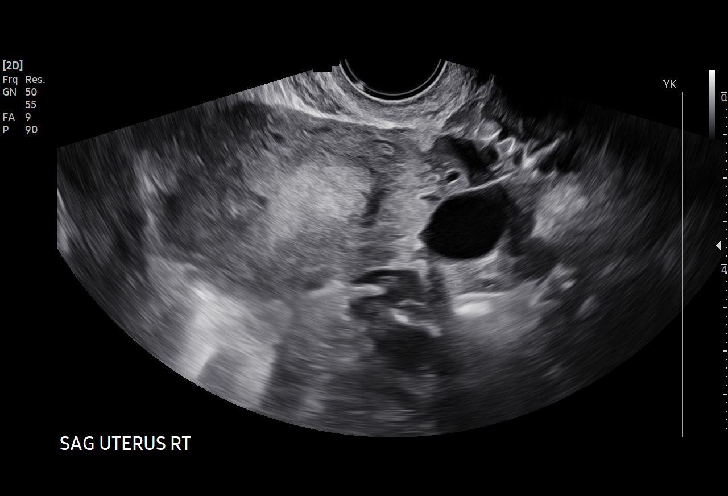
[im 15/68]
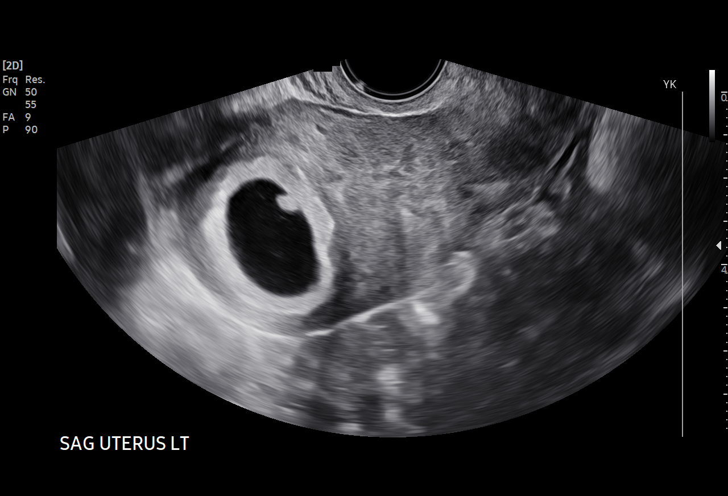
[im 20/68]
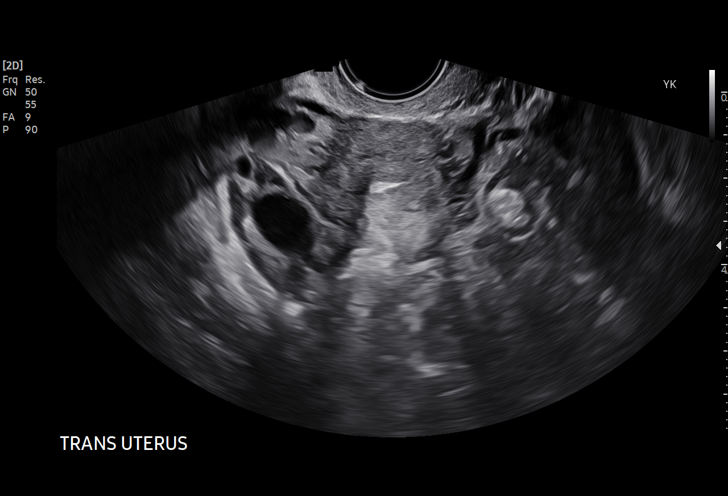
[im 25/68]
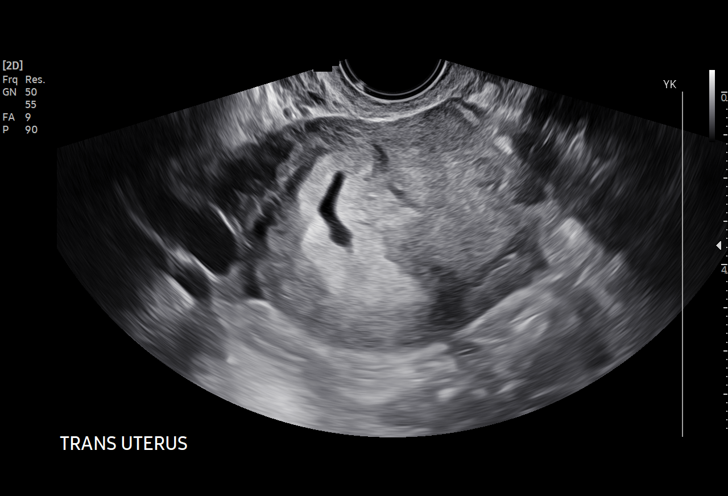
[im 30/68]
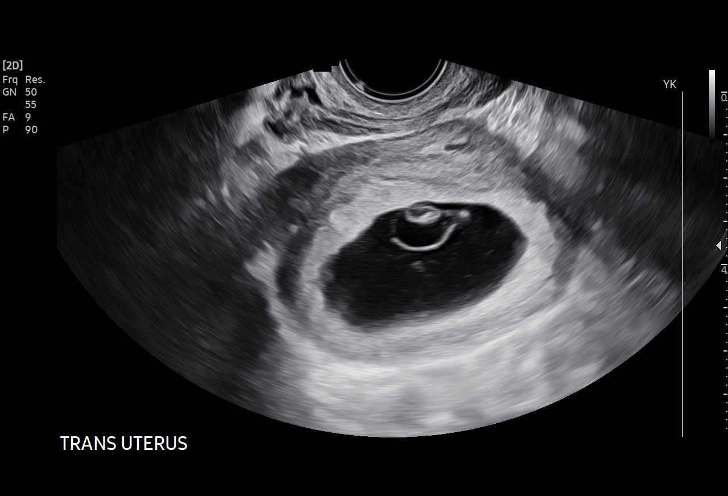
[im 35/68]
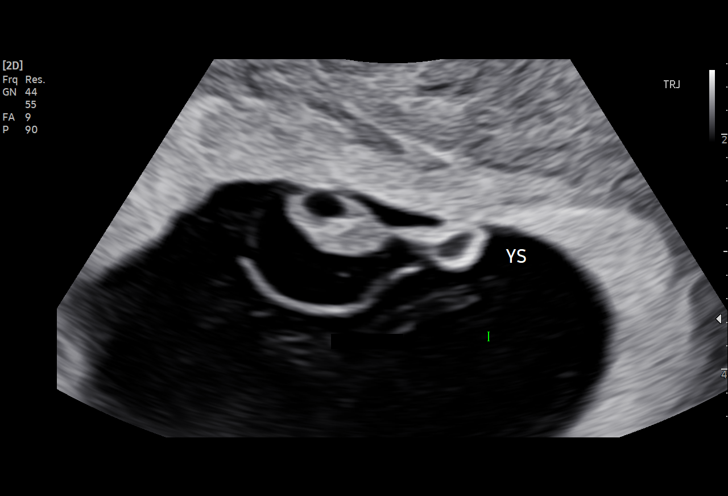
[im 38/68]
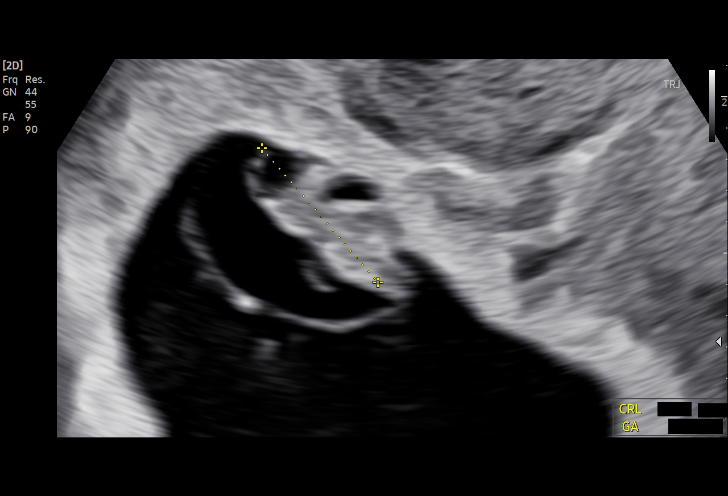
[im 43/68]
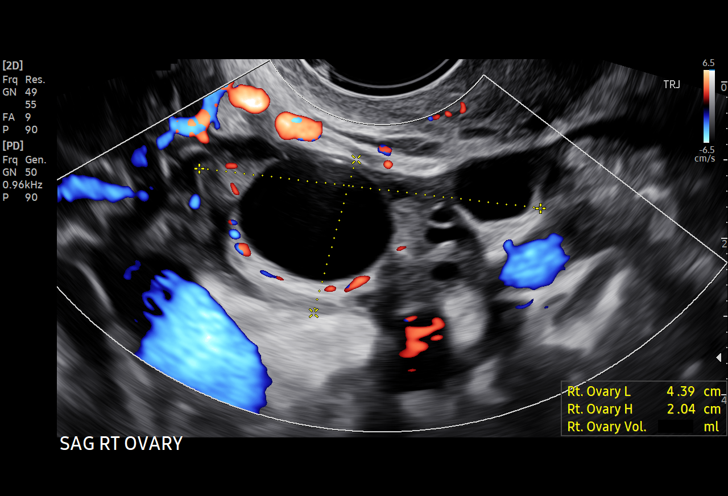
[im 48/68]
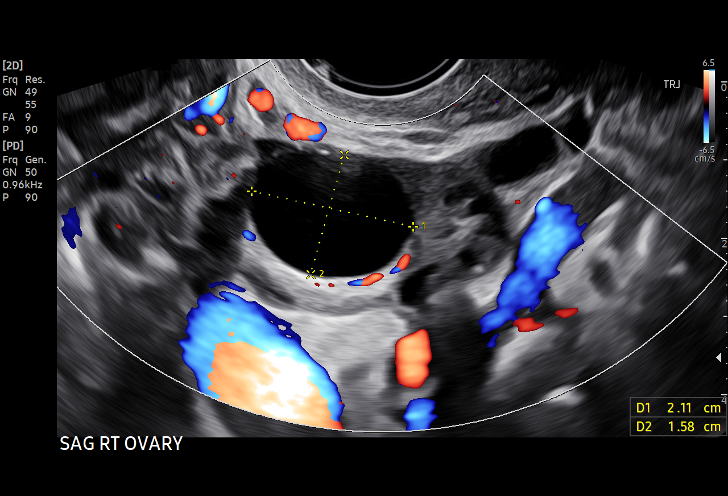
[im 53/68]
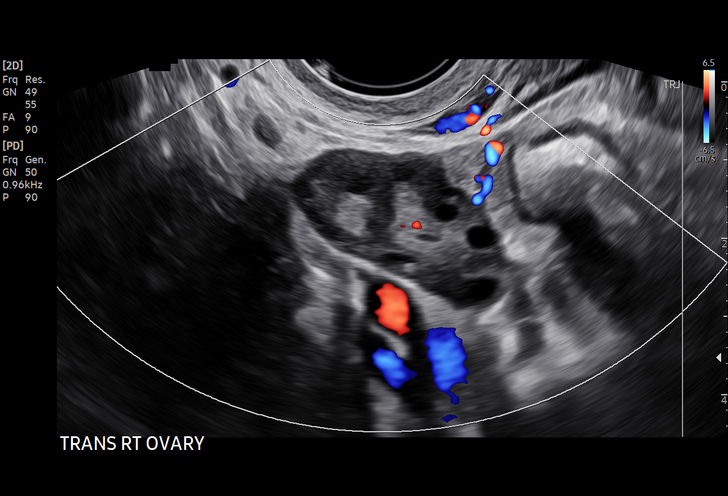
[im 58/68]
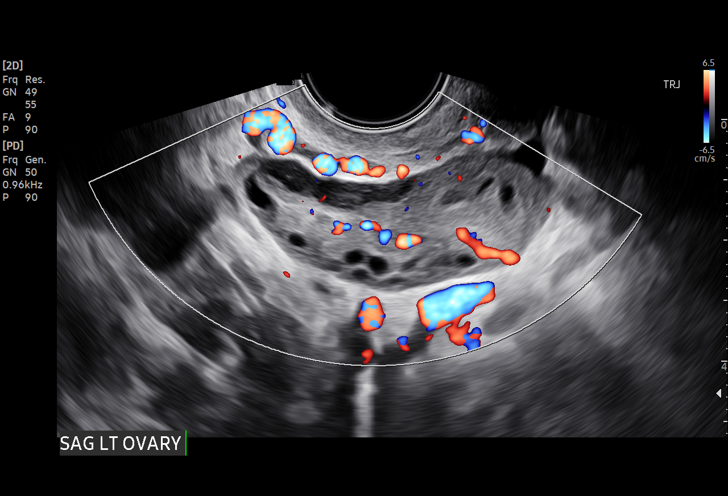
[im 63/68]
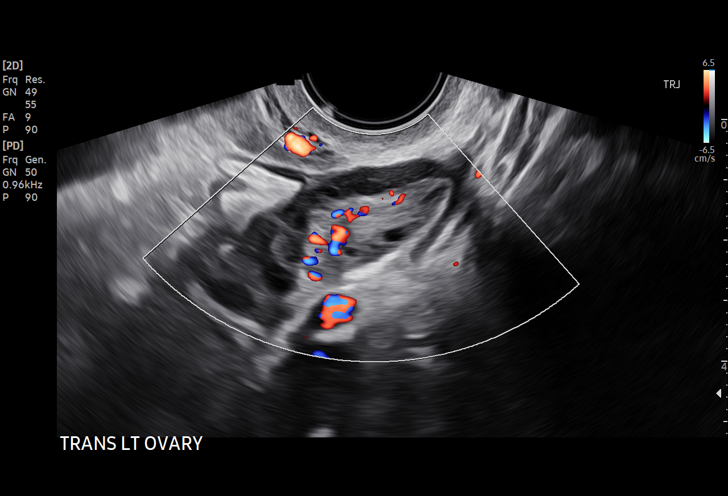
[im 68/68]
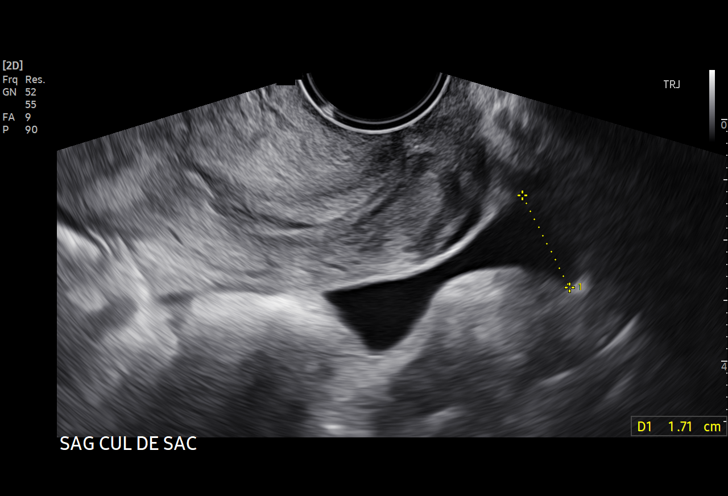

[15 of 28 positions shown; findings below may reference images not displayed]

FINDINGS: Intrauterine gestational sac: Single

Yolk sac:  Visualized.

Embryo:  Visualized.

Cardiac Activity: Visualized.

Heart Rate: 161 bpm

CRL: 11.5  mm   7 w 2 d                  US EDC: 01/21/2021

Subchorionic hemorrhage:  None visualized.

Maternal uterus/adnexae: 2.2 cm anechoic right ovarian mass likely
reflecting a follicle or cyst. Second cystic area adjacent to versus
arising from the right ovary which may reflect an exophytic cyst or
focally dilated fallopian tube. Left ovarian corpus luteal cyst.
IMPRESSION: Single live intrauterine pregnancy as detailed above.

## 2022-04-30 IMAGING — US US MFM OB DETAIL+14 WK
1 series · 13 of 28 positions shown · non-contrast
Comparison: none

[Series 1: us mfm ob detail+14 wk · 13 of 71 slices shown]
[im 3/71]
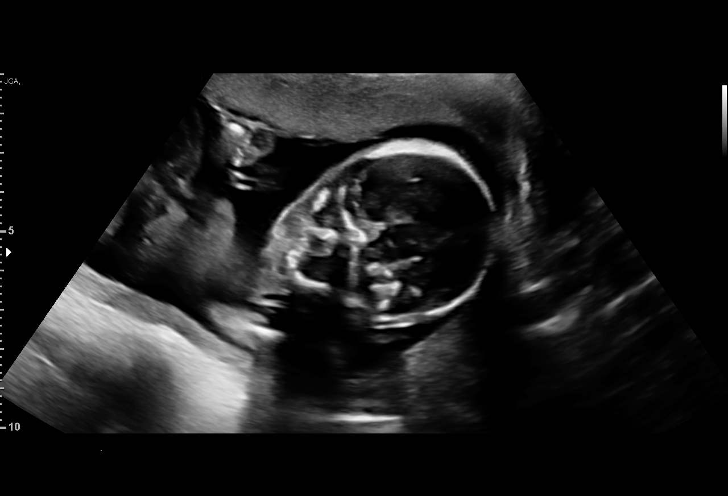
[im 8/71]
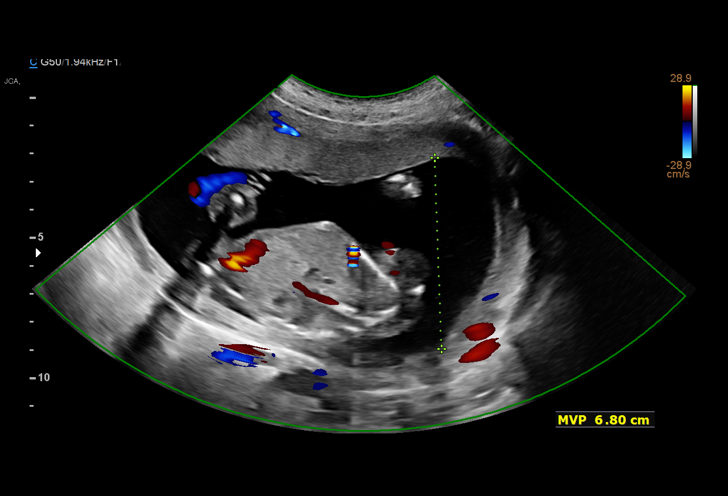
[im 13/71]
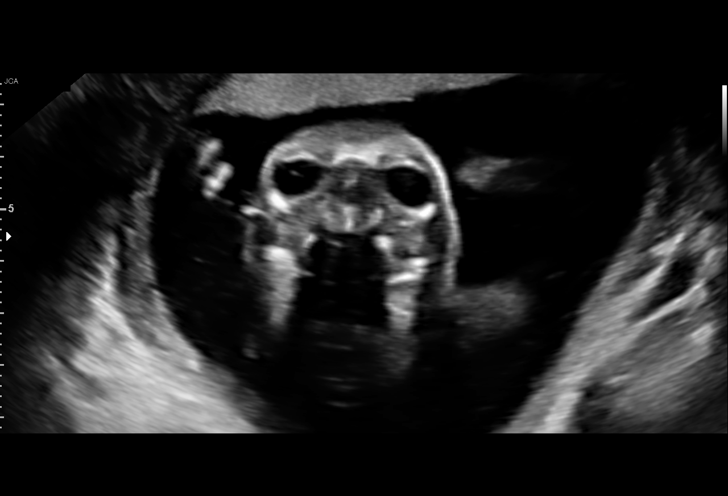
[im 19/71]
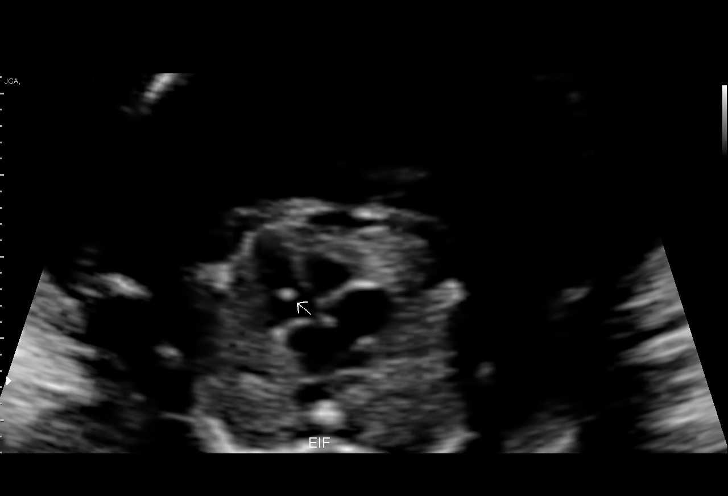
[im 24/71]
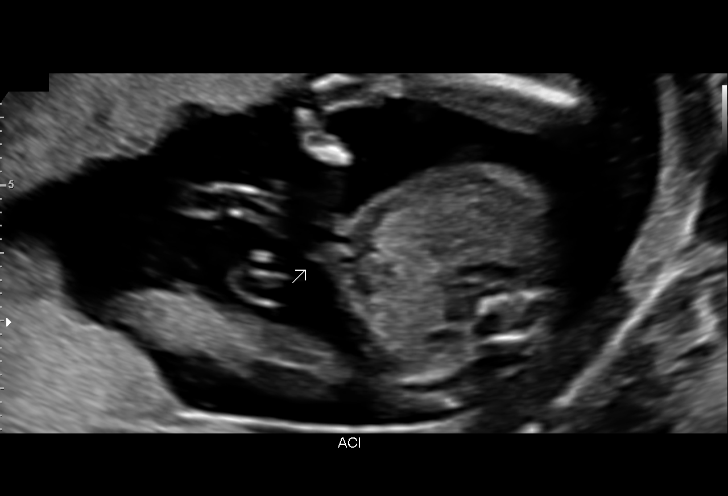
[im 29/71]
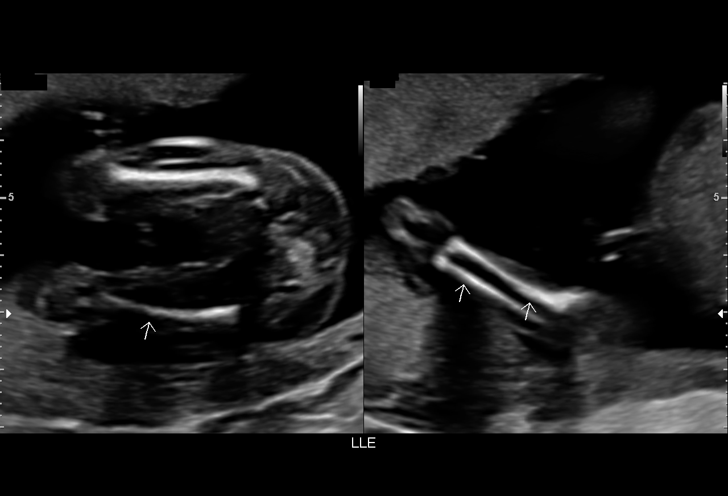
[im 37/71]
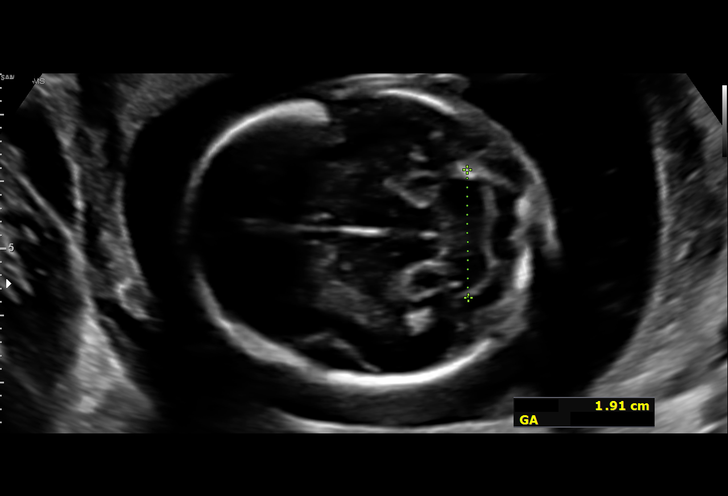
[im 42/71]
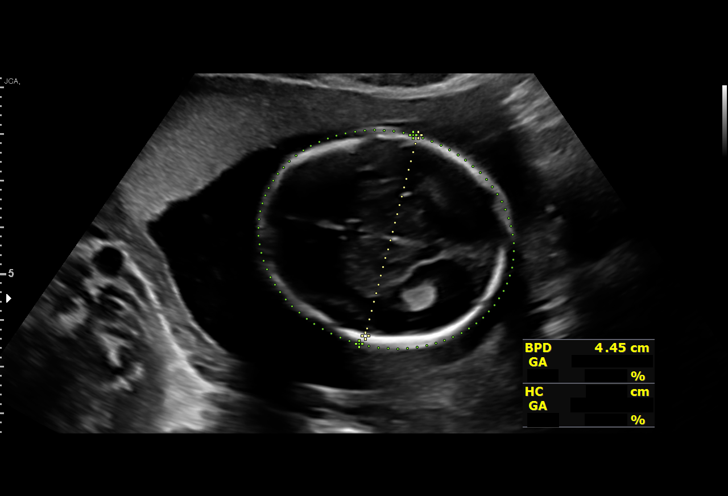
[im 47/71]
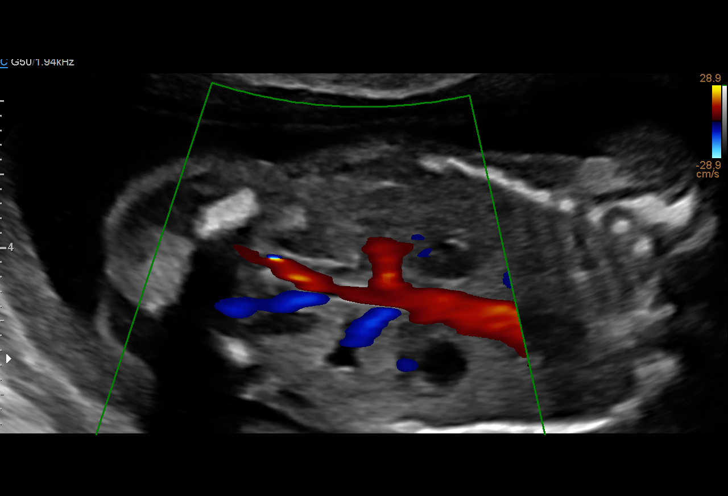
[im 52/71]
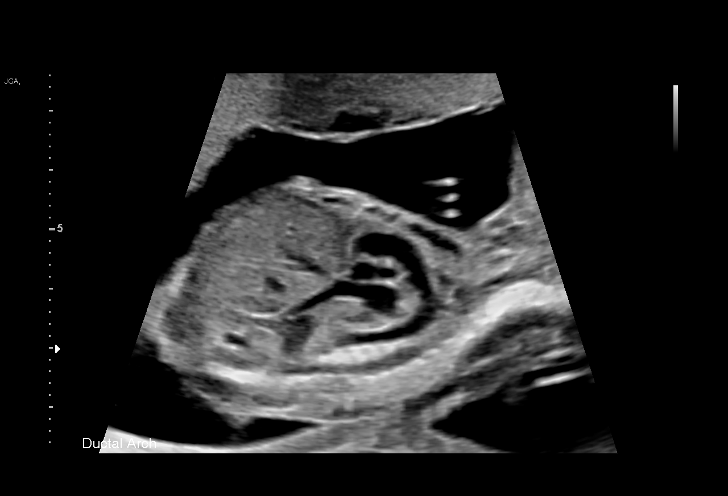
[im 58/71]
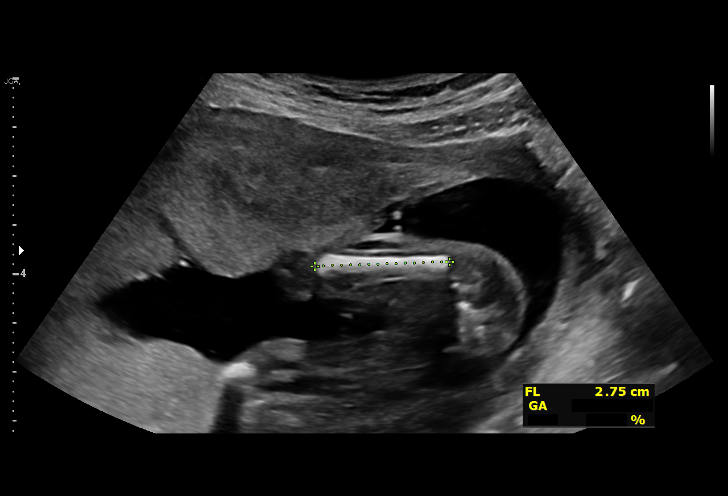
[im 63/71]
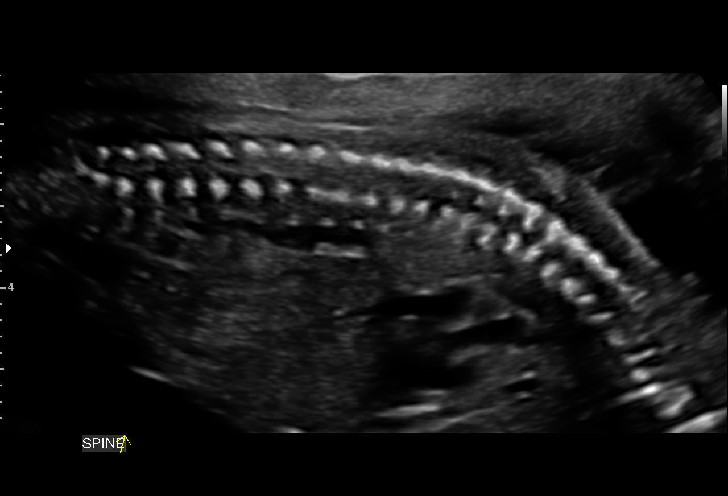
[im 68/71]
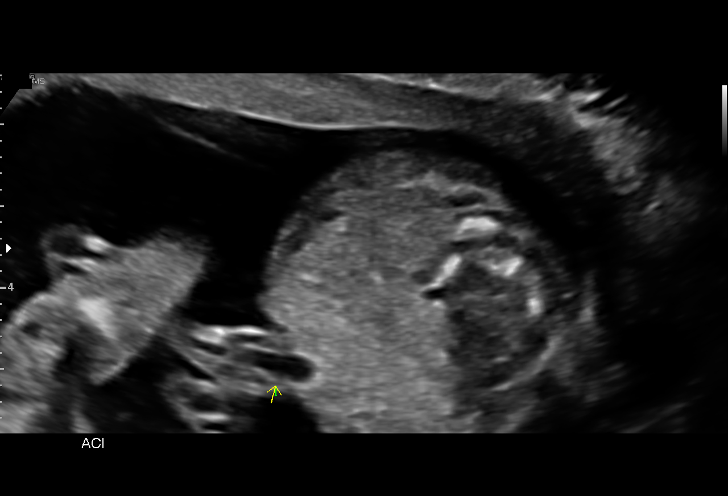

[13 of 28 positions shown; findings below may reference images not displayed]

[REDACTED]
                   BECHLI CNM

Indications

 History of sickle cell trait
 19 weeks gestation of pregnancy
 Encounter for antenatal screening for
 malformations
 Echogenic intracardiac focus of the heart
 (EIF)
Fetal Evaluation

 Num Of Fetuses:         1
 Cardiac Activity:       Observed
 Presentation:           Cephalic
 Placenta:               Anterior
 P. Cord Insertion:      Visualized

 Amniotic Fluid
 AFI FV:      Within normal limits

                             Largest Pocket(cm)

Biometry

 BPD:        45  mm     G. Age:  19w 4d         76  %    CI:        78.11   %    70 - 86
                                                         FL/HC:      17.1   %    16.1 -
 HC:      161.1  mm     G. Age:  18w 6d         38  %    HC/AC:      1.20        1.09 -
 AC:      134.8  mm     G. Age:  19w 0d         44  %    FL/BPD:     61.1   %
 FL:       27.5  mm     G. Age:  18w 3d         23  %    FL/AC:      20.4   %    20 - 24
 HUM:      26.3  mm     G. Age:  18w 2d         31  %
 CER:      19.1  mm     G. Age:  18w 5d         19  %

 LV:        5.8  mm
 Est. FW:     255  gm      0 lb 9 oz     31  %
Gestational Age

 LMP:           19w 0d        Date:  04/10/20                 EDD:   01/15/21
 U/S Today:     19w 0d                                        EDD:   01/15/21
 Best:          19w 0d     Det. By:  LMP  (04/10/20)          EDD:   01/15/21
Anatomy

 Cranium:               Appears normal         Aortic Arch:            Appears normal
 Cavum:                 Appears normal         Ductal Arch:            Appears normal
 Ventricles:            Appears normal         Diaphragm:              Appears normal
 Choroid Plexus:        Appears normal         Stomach:                Appears normal, left
                                                                       sided
 Cerebellum:            Appears normal         Abdomen:                Appears normal
 Posterior Fossa:       Appears normal         Abdominal Wall:         Appears nml (cord
                                                                       insert, abd wall)
 Nuchal Fold:           Appears normal         Cord Vessels:           Appears normal (3
                                                                       vessel cord)
 Face:                  Appears normal         Kidneys:                Appear normal
                        (orbits and profile)
 Lips:                  Appears normal         Bladder:                Appears normal
 Thoracic:              Appears normal         Spine:                  Appears normal
 Heart:                 Appears normal; EIF    Upper Extremities:      Not well visualized
 RVOT:                  Appears normal         Lower Extremities:      Appears normal
 LVOT:                  Appears normal

 Other:  Nasal bone visualized. Fetus appears to be a male. Technically
         difficult due to fetal position.
Cervix Uterus Adnexa

 Cervix
 Length:           3.01  cm.
 Normal appearance by transabdominal scan.
Impression

 G2 P1. Patient is here for fetal anatomy scan.
 On cell-free fetal DNA screening, the risks of fetal
 aneuploidies are not increased .

 We performed fetal anatomy scan. An echogenic intracardiac
 focus is seen. No other makers of aneuploidies or fetal
 structural defects are seen. Fetal biometry is consistent with
 her previously-established dates. Amniotic fluid is normal and
 good fetal activity is seen.
 I informed the patient that given that she had [employer] [facility]ing for fetal
 aneuploidies on cell-free fetal DNA screening, finding of
 echogenic intracardiac focus should be considered a normal
 variant and that the risk of trisomy 21 is not increased. I also
 reassured that echogenic focus does not increase the risk of
 cardiac defects. I also informed her that only amniocentesis
 will give a defintive result on the fetal karyotype.
 Patient opted not to have amniocentesis.
Recommendations

 -An appointment was made for her to return in 4 weeks for
 completion of fetal anatomy (upper extremities).
                 Mellad, Jaihon

## 2022-05-27 IMAGING — US US MFM OB FOLLOW-UP
1 series · 14 of 28 positions shown · non-contrast
Comparison: none

[Series 1: us mfm ob follow-up · 14 of 43 slices shown]
[im 2/43]
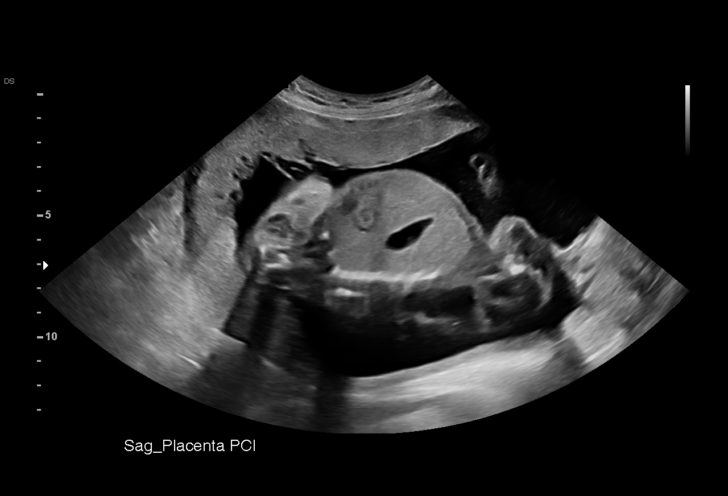
[im 5/43]
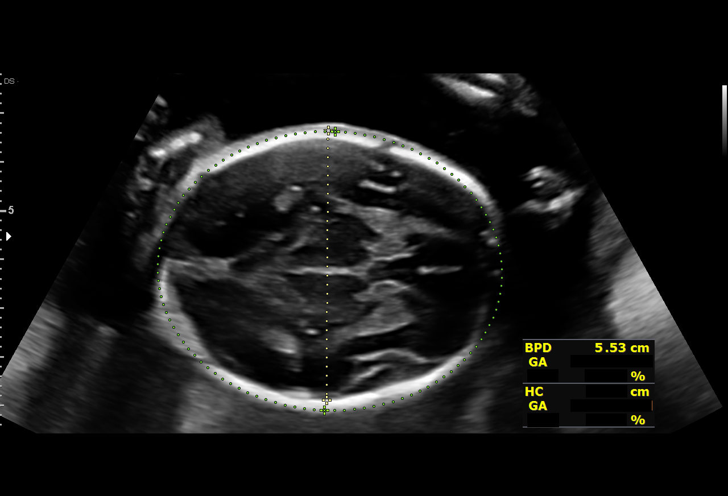
[im 8/43]
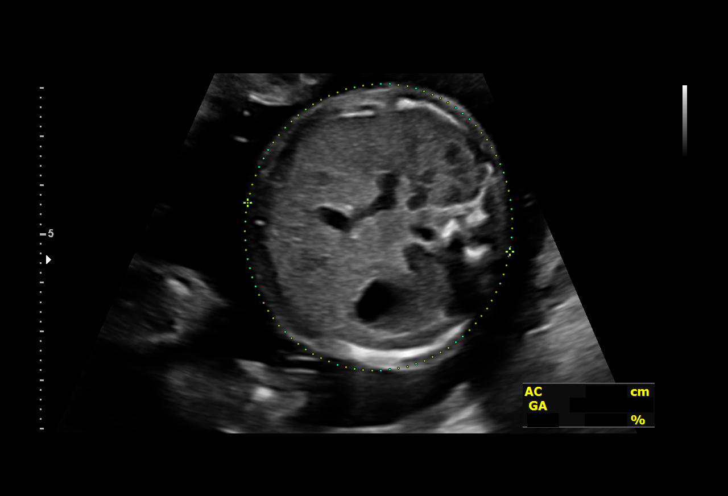
[im 11/43]
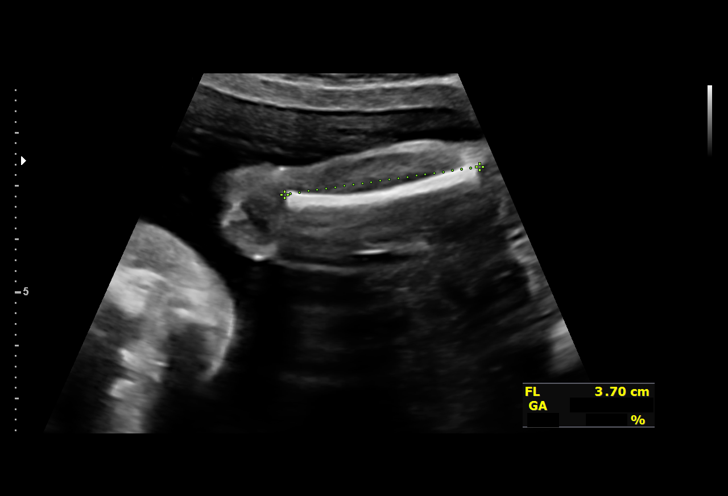
[im 15/43]
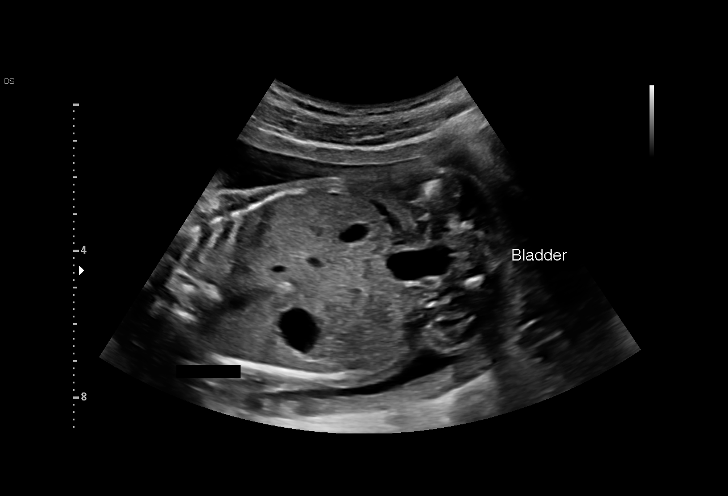
[im 18/43]
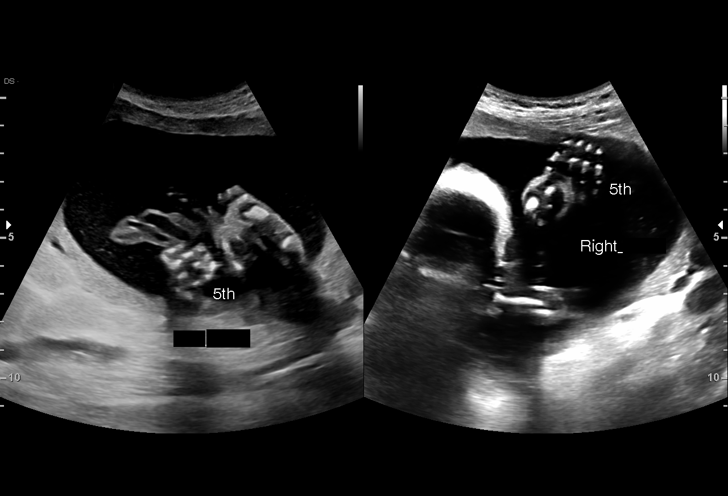
[im 21/43]
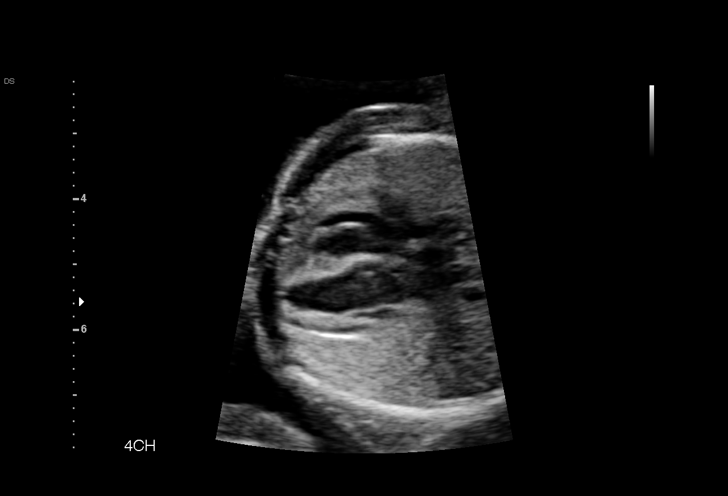
[im 24/43]
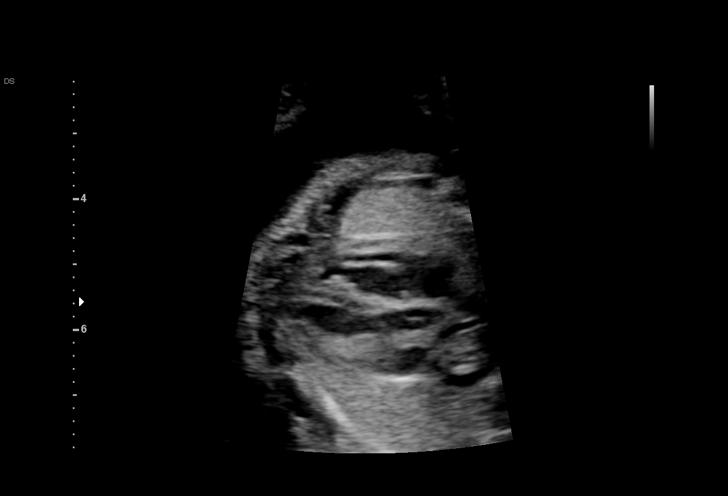
[im 27/43]
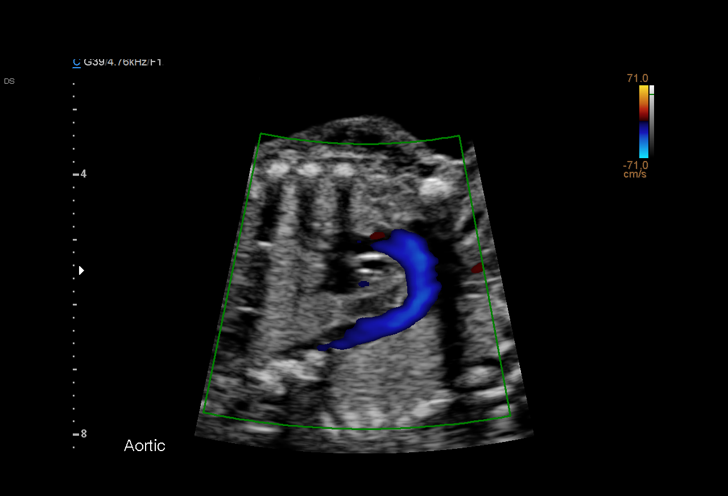
[im 30/43]
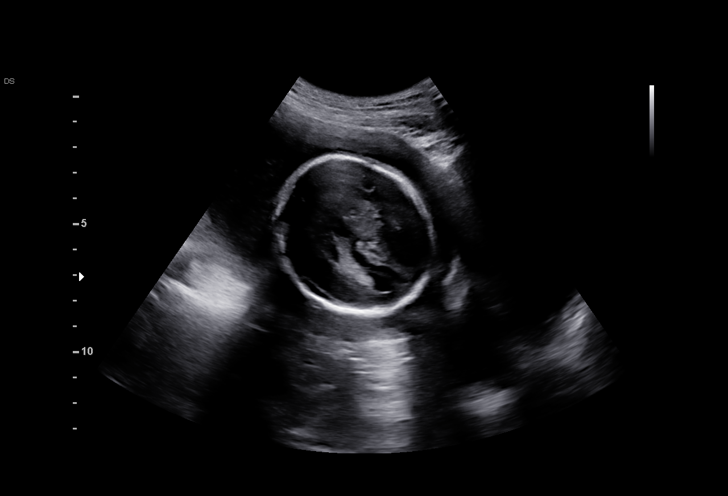
[im 33/43]
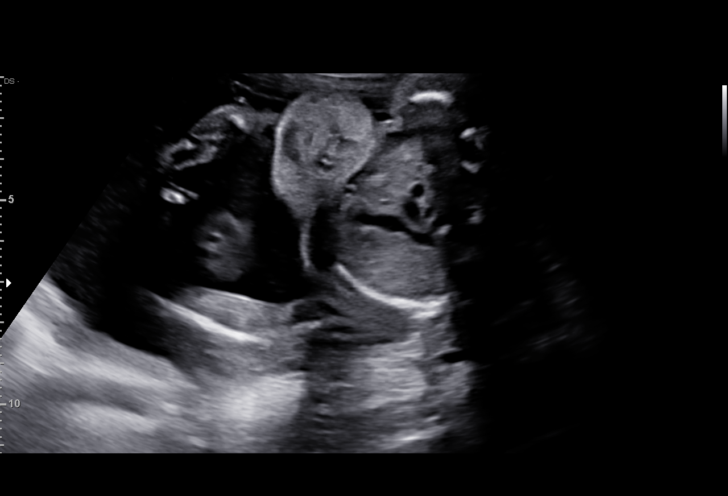
[im 36/43]
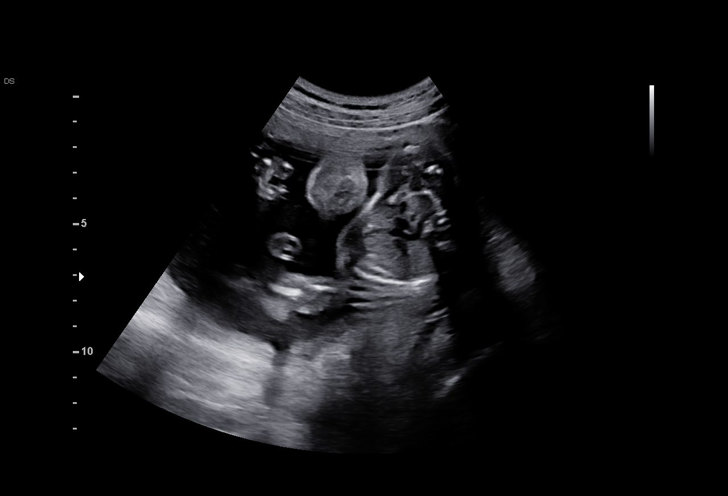
[im 39/43]
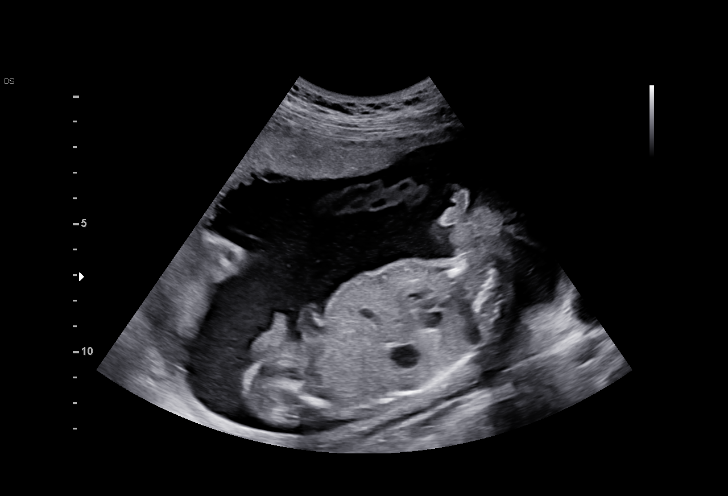
[im 43/43]
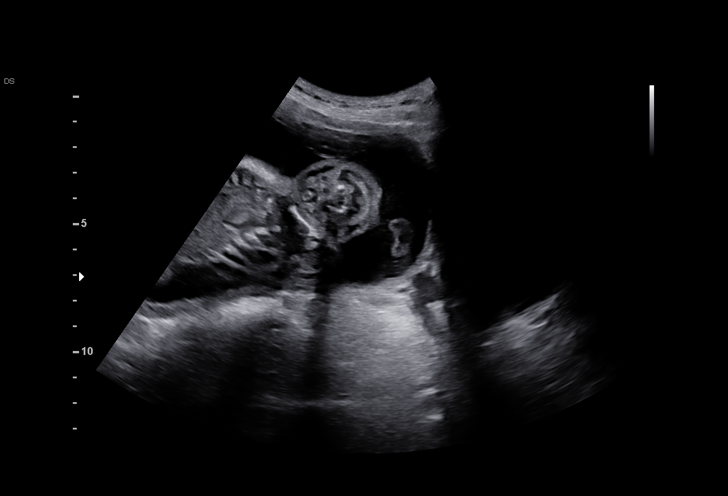

[14 of 28 positions shown; findings below may reference images not displayed]

[REDACTED]
                   ANWR CNM

Indications

 Echogenic intracardiac focus of the heart
 (EIF)
 History of sickle cell trait
 Encounter for other antenatal screening
 follow-up
 22 weeks gestation of pregnancy
Fetal Evaluation

 Num Of Fetuses:         1
 Cardiac Activity:       Observed
 Presentation:           Variable
 Placenta:               Anterior Fundal
 P. Cord Insertion:      Visualized, central

 Amniotic Fluid
 AFI FV:      Within normal limits

                             Largest Pocket(cm)

Biometry

 BPD:      55.4  mm     G. Age:  22w 6d         47  %    CI:        75.23   %    70 - 86
                                                         FL/HC:      18.4   %    19.2 -
 HC:      202.6  mm     G. Age:  22w 3d         19  %    HC/AC:      1.14        1.05 -
 AC:       178   mm     G. Age:  22w 5d         36  %    FL/BPD:     67.1   %    71 - 87
 FL:       37.2  mm     G. Age:  21w 6d         12  %    FL/AC:      20.9   %    20 - 24
 LV:          6  mm
 Est. FW:     496  gm      1 lb 1 oz     21  %
Gestational Age

 LMP:           22w 6d        Date:  04/10/20                 EDD:   01/15/21
 U/S Today:     22w 3d                                        EDD:   01/18/21
 Best:          22w 6d     Det. By:  LMP  (04/10/20)          EDD:   01/15/21
Anatomy

 Cranium:               Appears normal         Aortic Arch:            Appears normal
 Cavum:                 Appears normal         Ductal Arch:            Previously seen
 Ventricles:            Appears normal         Diaphragm:              Previously seen
 Choroid Plexus:        Appears normal         Stomach:                Appears normal, left
                                                                       sided
 Cerebellum:            Appears normal         Abdomen:                Previously seen
 Posterior Fossa:       Appears normal         Abdominal Wall:         Appears nml (cord
                                                                       insert, abd wall)
 Nuchal Fold:           Not applicable (>20    Cord Vessels:           Previously seen
                        wks GA)
 Face:                  Appears normal         Kidneys:                Appear normal
                        (orbits and profile)
 Lips:                  Appears normal         Bladder:                Appears normal
 Thoracic:              Previously seen        Spine:                  Previously seen
 Heart:                 Appears normal         Upper Extremities:      Appears normal
                        (4CH, axis, and
                        situs)
 RVOT:                  Appears normal         Lower Extremities:      Previously seen
 LVOT:                  Appears normal

 Other:  Fetus appears to be a male. Upper extremities seen this exam.
Cervix Uterus Adnexa

 Cervix
 Length:           3.71  cm.
 Normal appearance by transabdominal scan.

 Right Ovary
 Within normal limits.

 Left Ovary
 Within normal limits.
Comments

 This patient was seen for a follow up growth scan and to
 obtain better images of the upper extremities.  She denies
 any problems since her last exam.
 She was informed that the fetal growth and amniotic fluid
 level appears appropriate for her gestational age.
 The views of the upper extremities were visualized today.
 No further exams were scheduled in our office.
